# Patient Record
Sex: Male | Born: 1980 | Race: Black or African American | Hispanic: No | Smoking: Former smoker
Health system: Southern US, Community
[De-identification: ages and names within clinical notes are randomized; demographics above are authoritative.]

## PROBLEM LIST (undated history)

## (undated) DIAGNOSIS — M1990 Unspecified osteoarthritis, unspecified site: Secondary | ICD-10-CM

## (undated) DIAGNOSIS — I7300 Raynaud's syndrome without gangrene: Secondary | ICD-10-CM

## (undated) DIAGNOSIS — N341 Nonspecific urethritis: Secondary | ICD-10-CM

## (undated) DIAGNOSIS — L089 Local infection of the skin and subcutaneous tissue, unspecified: Secondary | ICD-10-CM

## (undated) DIAGNOSIS — M109 Gout, unspecified: Secondary | ICD-10-CM

## (undated) HISTORY — DX: Raynaud's syndrome without gangrene: I73.00

## (undated) HISTORY — DX: Nonspecific urethritis: N34.1

## (undated) HISTORY — DX: Local infection of the skin and subcutaneous tissue, unspecified: L08.9

---
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
## 2002-08-18 ENCOUNTER — Emergency Department (HOSPITAL_COMMUNITY): Admission: EM | Admit: 2002-08-18 | Discharge: 2002-08-18 | Payer: Self-pay | Admitting: Emergency Medicine

## 2002-09-14 ENCOUNTER — Emergency Department (HOSPITAL_COMMUNITY): Admission: EM | Admit: 2002-09-14 | Discharge: 2002-09-14 | Payer: Self-pay | Admitting: Emergency Medicine

## 2003-01-20 ENCOUNTER — Emergency Department (HOSPITAL_COMMUNITY): Admission: AD | Admit: 2003-01-20 | Discharge: 2003-01-20 | Payer: Self-pay | Admitting: Family Medicine

## 2004-07-25 ENCOUNTER — Emergency Department (HOSPITAL_COMMUNITY): Admission: EM | Admit: 2004-07-25 | Discharge: 2004-07-25 | Payer: Self-pay | Admitting: Family Medicine

## 2006-04-10 ENCOUNTER — Emergency Department (HOSPITAL_COMMUNITY): Admission: EM | Admit: 2006-04-10 | Discharge: 2006-04-10 | Payer: Self-pay | Admitting: Emergency Medicine

## 2006-06-16 ENCOUNTER — Emergency Department (HOSPITAL_COMMUNITY): Admission: EM | Admit: 2006-06-16 | Discharge: 2006-06-16 | Payer: Self-pay | Admitting: Emergency Medicine

## 2010-02-28 ENCOUNTER — Emergency Department (HOSPITAL_COMMUNITY)
Admission: EM | Admit: 2010-02-28 | Discharge: 2010-02-28 | Payer: Self-pay | Source: Home / Self Care | Admitting: Emergency Medicine

## 2010-04-13 ENCOUNTER — Emergency Department (HOSPITAL_COMMUNITY)
Admission: EM | Admit: 2010-04-13 | Discharge: 2010-04-13 | Payer: Self-pay | Source: Home / Self Care | Admitting: Emergency Medicine

## 2010-06-26 LAB — CBC
Platelets: 158 10*3/uL (ref 150–400)
RBC: 4.91 MIL/uL (ref 4.22–5.81)
WBC: 8.7 10*3/uL (ref 4.0–10.5)

## 2010-06-26 LAB — DIFFERENTIAL
Lymphocytes Relative: 45 % (ref 12–46)
Lymphs Abs: 3.9 10*3/uL (ref 0.7–4.0)
Neutro Abs: 3.7 10*3/uL (ref 1.7–7.7)
Neutrophils Relative %: 43 % (ref 43–77)

## 2010-09-05 DIAGNOSIS — L089 Local infection of the skin and subcutaneous tissue, unspecified: Secondary | ICD-10-CM | POA: Insufficient documentation

## 2010-09-17 DIAGNOSIS — L089 Local infection of the skin and subcutaneous tissue, unspecified: Secondary | ICD-10-CM

## 2010-09-24 ENCOUNTER — Other Ambulatory Visit: Payer: Self-pay | Admitting: Infectious Disease

## 2010-09-24 ENCOUNTER — Encounter: Payer: Self-pay | Admitting: Infectious Disease

## 2010-09-24 ENCOUNTER — Ambulatory Visit (INDEPENDENT_AMBULATORY_CARE_PROVIDER_SITE_OTHER): Payer: Self-pay | Admitting: Infectious Disease

## 2010-09-24 VITALS — BP 114/59 | HR 73 | Temp 98.3°F | Ht 75.0 in | Wt 246.0 lb

## 2010-09-24 DIAGNOSIS — M359 Systemic involvement of connective tissue, unspecified: Secondary | ICD-10-CM

## 2010-09-24 DIAGNOSIS — R768 Other specified abnormal immunological findings in serum: Secondary | ICD-10-CM | POA: Insufficient documentation

## 2010-09-24 DIAGNOSIS — L089 Local infection of the skin and subcutaneous tissue, unspecified: Secondary | ICD-10-CM

## 2010-09-24 DIAGNOSIS — M069 Rheumatoid arthritis, unspecified: Secondary | ICD-10-CM

## 2010-09-24 LAB — CBC WITH DIFFERENTIAL/PLATELET
Basophils Absolute: 0 10*3/uL (ref 0.0–0.1)
Eosinophils Absolute: 0.2 10*3/uL (ref 0.0–0.7)
Lymphocytes Relative: 41 % (ref 12–46)
Lymphs Abs: 3 10*3/uL (ref 0.7–4.0)
MCH: 27 pg (ref 26.0–34.0)
Neutrophils Relative %: 47 % (ref 43–77)
Platelets: 154 10*3/uL (ref 150–400)
RBC: 5.08 MIL/uL (ref 4.22–5.81)
RDW: 14.5 % (ref 11.5–15.5)
WBC: 7.3 10*3/uL (ref 4.0–10.5)

## 2010-09-24 LAB — HIV ANTIBODY (ROUTINE TESTING W REFLEX): HIV: NONREACTIVE

## 2010-09-24 NOTE — Patient Instructions (Signed)
This is a connective tissue disorder, most likely rheumatoid arthritis vs psoriatic arthritis  We will do blood work today but you will need to be seen by a Rheumatologist

## 2010-09-24 NOTE — Assessment & Plan Note (Signed)
See discussion below.

## 2010-09-24 NOTE — Assessment & Plan Note (Signed)
He likely did have an infection involving his middle finger of the other nodules that he has been scratching at currently there is no evidence of infection are are present at all. Certainly the underlying pathology that is causing the nodules in the swelling and pain in his digits is not an infectious one. It is possible to pause post infectious "Reiters" arthritis. But infection isn't the primary problem here.

## 2010-09-24 NOTE — Assessment & Plan Note (Signed)
I am suspicious that this patient has either rheumatoid arthritis or another connective tissue disease that is in the differential for for rheumatoid arthritis such as possibly psoriatic arthritis. He has what appear to be nodules overlying his PIP joints. He has episodes of swelling and pain in the PIP joints. He has what sounds like Raynaud's phenomenon. I will check her rheumatoid factor ANA a anti-CCA body. We'll check a viral hepatitis antibodies although I don't think he has any infectious disease mediating his arthritic condition. I would be happy to prescribe him corticosteroid should he need it but he does not seem to be in separate and severe pain at this point in time I would prefer to have him to be worked up by rheumatologist. In addition to the labs mentioned above we'll get a stat sedimentation rate C-reactive protein CBC differential count are his metabolic panel her if will refer him to rheumatology a loss for for him to a general S.

## 2010-09-24 NOTE — Progress Notes (Signed)
Subjective:    Patient ID: [name], male    DOB: 1980/12/29, 30 y.o.   MRN: [id number]  HPI  [name] is a 30 year old African American male who had not had any significant past medical history until approximately a year ago at which point in time he developed swelling of his fingers. He prior to this he had noticed what sounds like Raynaud's type phenomena where he would have discoloration of the proximal digits of his fingers with exposure to severe cold. He doesn't have a numbness sensation and pain present. Since then he had developed swelling as well and is in his both his proximal and distal interphalangeal interphalangeal joints and had developed nodules on the overlying the PIP joints. He has no primary care physician is not been seen for by rheumatologist for workup of this likely connective tissue disease. In the interim he has had superinfections of some of the nodules including one over his right middle finger. This was evaluated and responded at count where there was evidence of gas in the soft tissue. Given prescriptions for antibiotics and Fu with [place] with these. Since then the patient then seen again in the ER again for pain and in his hands and for the nodules. Occasionally these nodules will drain internally material but not all that often. They typically will this will typically happen after he scratches them. He is also noticed an area of excoriation overlying his left thumb. He was referred to Dr. Virginia Vasquez and lower with hand surgery for evaluation and treatment of his condition. Refer the patient to [place]. In talking to the patient he was evaluated for nongonococcal urethritis in 2004. He denies having any other episodes of this. He denies having such symptoms prior to the development of his pain in his PIP and PIP joints and the development of his nodules. He denies any family history of any connective tissue diseases. He denies any history of other sexual transmitted diseases he denies  being tested for HIV of her. He states he only had sex sex with women and claims over 30 different partners in his lifetime. He is accompanied today by his girlfriend who did leave the room during the interview with regards to his sexual history. He did he endorses use of alcohol a weekly basis. He also endorses marijuana use he denies cocaine and denies all intravenous drug use. Review of Systems    as in history present illness otherwise remainder of 12 RV of systems is negative. Objective:   Physical Exam    patient is alert and oriented x4. HEENT is normocephalic atraumatic his pupils are equal round react to light his sclera anicteric his oropharynx is clear without erythema exudate. Dentate dentition was fair progress exam reveal regular rate and rhythm no murmurs rubs rubs lungs are clear auscultation bilaterally without wheezes or rales abdomen soft nondistended nontender without evidence of hepatomegaly megaly or splenomegaly. His extremities are without edema. Neurologic exam was nonfocal with symmetrical strength and sensation and renal nerves were grossly intact. Skeletal exam revealed nodules overlying the PIP joints bilaterally. His PIPs joints were prominent but not tender to palpation. MCP joints were also not tender not guarded neither were his wrists or elbows. The PIP joints were also not especially tender. The skin on all overlying the DIP joint with her stress somewhat stretched shiny appearance to it the nails themselves did not have any lesions present on them he did have excoriation however on his thumb. He also another area  of dry skin excoriated skin on overlying his right elbow.    Assessment & Plan:  Connective tissue disease I am suspicious that this patient has either rheumatoid arthritis or another connective tissue disease that is in the differential for for rheumatoid arthritis such as possibly psoriatic arthritis. He has what appear to be nodules overlying his PIP  joints. He has episodes of swelling and pain in the PIP joints. He has what sounds like Raynaud's phenomenon. I will check her rheumatoid factor ANA a anti-CCA body. We'll check a viral hepatitis antibodies although I don't think he has any infectious disease mediating his arthritic condition. I would be happy to prescribe him corticosteroid should he need it but he does not seem to be in separate and severe pain at this point in time I would prefer to have him to be worked up by rheumatologist. In addition to the labs mentioned above we'll get a stat sedimentation rate C-reactive protein CBC differential count are his metabolic panel her if will refer him to rheumatology a loss for for him to a general S.  skin infection, bilateral hands He likely did have an infection involving his middle finger of the other nodules that he has been scratching at currently there is no evidence of infection are are present at all. Certainly the underlying pathology that is causing the nodules in the swelling and pain in his digits is not an infectious one. It is possible to pause post infectious "Reiters" arthritis. But infection isn't the primary problem here.  Rheumatoid arthritis See discussion below.

## 2010-09-25 LAB — RHEUMATOID FACTOR: Rhuematoid fact SerPl-aCnc: <10 IU/mL (ref <=14)

## 2010-09-25 LAB — HEPATITIS PANEL, ACUTE: Hep A IgM: NEGATIVE

## 2010-09-25 LAB — ANTI-NUCLEAR AB-TITER (ANA TITER): ANA Titer 1: 1:1280 {titer} — ABNORMAL HIGH

## 2010-09-25 LAB — C-REACTIVE PROTEIN: CRP: 0.2 mg/dL (ref <0.6)

## 2010-09-26 LAB — CYCLIC CITRUL PEPTIDE ANTIBODY, IGG: Cyclic Citrullin Peptide Ab: <2 U/mL (ref 0.0–5.0)

## 2010-10-24 ENCOUNTER — Ambulatory Visit: Payer: Self-pay | Admitting: Infectious Disease

## 2011-01-20 ENCOUNTER — Emergency Department (HOSPITAL_COMMUNITY)
Admission: EM | Admit: 2011-01-20 | Discharge: 2011-01-20 | Disposition: A | Discharge status: Home / Self Care | Payer: Self-pay | Attending: Emergency Medicine | Admitting: Emergency Medicine

## 2011-01-20 DIAGNOSIS — K047 Periapical abscess without sinus: Secondary | ICD-10-CM | POA: Insufficient documentation

## 2011-01-20 DIAGNOSIS — R220 Localized swelling, mass and lump, head: Secondary | ICD-10-CM | POA: Insufficient documentation

## 2011-01-20 DIAGNOSIS — K089 Disorder of teeth and supporting structures, unspecified: Secondary | ICD-10-CM | POA: Insufficient documentation

## 2011-01-20 DIAGNOSIS — K029 Dental caries, unspecified: Secondary | ICD-10-CM | POA: Insufficient documentation

## 2017-07-10 ENCOUNTER — Encounter (HOSPITAL_COMMUNITY): Payer: Self-pay

## 2017-07-10 ENCOUNTER — Emergency Department (HOSPITAL_COMMUNITY): Payer: Self-pay

## 2017-07-10 ENCOUNTER — Emergency Department (HOSPITAL_COMMUNITY)
Admission: EM | Admit: 2017-07-10 | Discharge: 2017-07-10 | Disposition: A | Discharge status: Home / Self Care | Payer: Self-pay | Attending: Emergency Medicine | Admitting: Emergency Medicine

## 2017-07-10 DIAGNOSIS — L98499 Non-pressure chronic ulcer of skin of other sites with unspecified severity: Secondary | ICD-10-CM

## 2017-07-10 DIAGNOSIS — L97521 Non-pressure chronic ulcer of other part of left foot limited to breakdown of skin: Secondary | ICD-10-CM | POA: Insufficient documentation

## 2017-07-10 DIAGNOSIS — F1210 Cannabis abuse, uncomplicated: Secondary | ICD-10-CM | POA: Insufficient documentation

## 2017-07-10 DIAGNOSIS — M069 Rheumatoid arthritis, unspecified: Secondary | ICD-10-CM | POA: Insufficient documentation

## 2017-07-10 LAB — BASIC METABOLIC PANEL
ANION GAP: 6 (ref 5–15)
BUN: 10 mg/dL (ref 6–20)
CALCIUM: 9.6 mg/dL (ref 8.9–10.3)
CO2: 30 mmol/L (ref 22–32)
CREATININE: 1.01 mg/dL (ref 0.61–1.24)
Chloride: 104 mmol/L (ref 101–111)
GFR calc Af Amer: >60 mL/min (ref >60)
GFR calc non Af Amer: >60 mL/min (ref >60)
GLUCOSE: 94 mg/dL (ref 65–99)
Potassium: 3.6 mmol/L (ref 3.5–5.1)
Sodium: 140 mmol/L (ref 135–145)

## 2017-07-10 LAB — CBC
HCT: 45.3 % (ref 39.0–52.0)
Hemoglobin: 14.2 g/dL (ref 13.0–17.0)
MCH: 27.3 pg (ref 26.0–34.0)
MCHC: 31.3 g/dL (ref 30.0–36.0)
MCV: 86.9 fL (ref 78.0–100.0)
Platelets: 212 10*3/uL (ref 150–400)
RBC: 5.21 MIL/uL (ref 4.22–5.81)
RDW: 13.9 % (ref 11.5–15.5)
WBC: 7.4 10*3/uL (ref 4.0–10.5)

## 2017-07-10 MED ORDER — SULFAMETHOXAZOLE-TRIMETHOPRIM 800-160 MG PO TABS: 1.0000 | ORAL_TABLET | Freq: Two times a day (BID) | ORAL | 0 refills | Status: AC

## 2017-07-10 NOTE — ED Provider Notes (Signed)
[facility]-EMERGENCY DEPT Provider Note   CSN: [id number] Arrival date & time: 07/10/17  1840     History   Chief Complaint Chief Complaint  Patient presents with  . Insect Bite    HPI [name] is a 37 y.o. male who presents today for evaluation of a wound on his lateral left foot.  He reports that it has been there for the past few months and is not getting better.  He reports that he showed it to a coworker who reported that it looked like a spider bite and so he presented today for evaluation.  He reports that he has not had any new shoes, however does note that he talks his shoelaces and on the side of his shoes and so it may have been rubbing there.  He reports that it is not overly painful.  No fevers chills nausea or vomiting.  He denies being a diabetic, reports that he has had a tetanus shot in the past few years.  HPI  Past Medical History:  Diagnosis Date  . NGU (nongonococcal urethritis)   . Skin infection     Patient Active Problem List   Diagnosis Date Noted  . Rheumatoid arthritis(714.0) 09/24/2010  . Connective tissue disease (HCC) 09/24/2010  . skin infection, bilateral hands 09/05/2010    History reviewed. No pertinent surgical history.      Home Medications    Prior to Admission medications   Medication Sig Start Date End Date Taking? Authorizing Provider  sulfamethoxazole-trimethoprim (BACTRIM DS,SEPTRA DS) 800-160 MG tablet Take 1 tablet by mouth 2 (two) times daily for 7 days. 07/10/17 07/17/17  Cristina Gong, PA-C    Family History Family History  Problem Relation Age of Onset  . Arthritis Mother   . Cancer Mother   . Arthritis Father   . Cancer Father     Social History Social History   Tobacco Use  . Smoking status: Never Smoker  . Smokeless tobacco: Never Used  Substance Use Topics  . Alcohol use: Yes    Alcohol/week: 1.2 oz    Types: 2 Cans of beer per week  . Drug use: Yes    Frequency: 3.0  times per week    Types: Marijuana     Allergies   Patient has no known allergies.   Review of Systems Review of Systems   Physical Exam Updated Vital Signs BP (!) 162/86 (BP Location: Left Arm)   Pulse 68   Temp 98 F (36.7 C) (Oral)   Resp 18   SpO2 100%   Physical Exam  Constitutional: He appears well-developed and well-nourished. No distress.  HENT:  Head: Normocephalic and atraumatic.  Eyes: Conjunctivae are normal. Right eye exhibits no discharge. Left eye exhibits no discharge. No scleral icterus.  Neck: Normal range of motion.  Cardiovascular: Normal rate and regular rhythm.  Pulmonary/Chest: Effort normal. No stridor. No respiratory distress.  Abdominal: He exhibits no distension.  Musculoskeletal: He exhibits no edema or deformity.  Neurological: He is alert. He exhibits normal muscle tone.  Skin: Skin is warm and dry. He is not diaphoretic.  There is a wound on the lateral aspect of the left foot.  Area was debrided.  See clinical image.  Wound is ulcer appearing, into subcutaneous tissue.   Psychiatric: He has a normal mood and affect. His behavior is normal.  Nursing note and vitals reviewed.        ED Treatments / Results  Labs (all labs  ordered are listed, but only abnormal results are displayed) Labs Reviewed  BASIC METABOLIC PANEL  CBC    EKG None  Radiology Dg Foot Complete Left  Result Date: 07/10/2017 CLINICAL DATA:  36 year old male with left foot wound for 1 month. EXAM: LEFT FOOT - COMPLETE 3+ VIEW COMPARISON:  None. FINDINGS: There is no acute fracture or dislocation. The bones are well mineralized. No arthritic changes. No bone erosion or periosteal elevation. Focal irregularity of the skin of the lateral aspect of the hindfoot corresponds to the known wound. IMPRESSION: Skin wound in the lateral hindfoot. No evidence of osteomyelitis by radiograph. Electronically Signed   By: Elgie Collard M.D.   On: 07/10/2017 22:41     Procedures Procedures (including critical care time)  Medications Ordered in ED Medications - No data to display   Initial Impression / Assessment and Plan / ED Course  I have reviewed the triage vital signs and the nursing notes.  Pertinent labs & imaging results that were available during my care of the patient were reviewed by me and considered in my medical decision making (see chart for details).    Patient presents today for evaluation of a wound on his left lateral foot for approximately 3 weeks.  He reports that he talks his shoelaces into his shoes  and suspect that that is what caused this.  He is afebrile, hemodynamically stable, based on appearance of wound concern for diabetes, BMP and CBC were obtained and normal.  Normal blood glucose.  X-rays were obtained without obvious evidence of subcutaneous air or osteomyelitis.  He will be started on Bactrim.  He was instructed to follow-up with his primary care provider, and that if he cannot be seen tomorrow and to come back in 2 days for wound check.  I personally instructed him on how to care for his wound and he stated his understanding.  Hemodynamically stable discharge home.   Final Clinical Impressions(s) / ED Diagnoses   Final diagnoses:  Skin ulcer, unspecified ulcer stage [facility])    ED Discharge Orders        Ordered    sulfamethoxazole-trimethoprim (BACTRIM DS,SEPTRA DS) 800-160 MG tablet  2 times daily     07/10/17 2331       Cristina Gong, PA-C 07/10/17 2346    Melene Plan, DO 07/11/17 1527

## 2017-07-10 NOTE — Discharge Instructions (Addendum)

## 2017-07-10 NOTE — ED Triage Notes (Signed)
Pt complains of a possible spider bite on his left foot, he says he didn't see a spider but noticed this area and it won't heal and is aching

## 2017-07-24 ENCOUNTER — Encounter (HOSPITAL_COMMUNITY): Payer: Self-pay | Admitting: *Deleted

## 2017-07-24 ENCOUNTER — Emergency Department (HOSPITAL_COMMUNITY)
Admission: EM | Admit: 2017-07-24 | Discharge: 2017-07-25 | Disposition: A | Discharge status: Home / Self Care | Payer: Self-pay | Attending: Emergency Medicine | Admitting: Emergency Medicine

## 2017-07-24 DIAGNOSIS — Z4800 Encounter for change or removal of nonsurgical wound dressing: Secondary | ICD-10-CM | POA: Insufficient documentation

## 2017-07-24 DIAGNOSIS — Z5189 Encounter for other specified aftercare: Secondary | ICD-10-CM

## 2017-07-24 LAB — CBC
HCT: 41.3 % (ref 39.0–52.0)
HEMOGLOBIN: 13 g/dL (ref 13.0–17.0)
MCH: 26.9 pg (ref 26.0–34.0)
MCHC: 31.5 g/dL (ref 30.0–36.0)
MCV: 85.5 fL (ref 78.0–100.0)
Platelets: 170 10*3/uL (ref 150–400)
RBC: 4.83 MIL/uL (ref 4.22–5.81)
RDW: 14 % (ref 11.5–15.5)
WBC: 6.4 10*3/uL (ref 4.0–10.5)

## 2017-07-24 LAB — BASIC METABOLIC PANEL
Anion gap: 9 (ref 5–15)
BUN: 10 mg/dL (ref 6–20)
CHLORIDE: 108 mmol/L (ref 101–111)
CO2: 24 mmol/L (ref 22–32)
Calcium: 9.3 mg/dL (ref 8.9–10.3)
Creatinine, Ser: 0.9 mg/dL (ref 0.61–1.24)
GFR calc Af Amer: >60 mL/min (ref >60)
GFR calc non Af Amer: >60 mL/min (ref >60)
GLUCOSE: 82 mg/dL (ref 65–99)
POTASSIUM: 3.7 mmol/L (ref 3.5–5.1)
SODIUM: 141 mmol/L (ref 135–145)

## 2017-07-24 MED ORDER — BACITRACIN ZINC 500 UNIT/GM EX OINT
TOPICAL_OINTMENT | Freq: Two times a day (BID) | CUTANEOUS | Status: DC
  Administered 2017-07-25: 1 via TOPICAL
  Filled 2017-07-24: qty 0.9

## 2017-07-24 NOTE — ED Triage Notes (Addendum)
Pt is here for re-evaluation of wound on the side of his left foot.  He was seen on 3/28 and given bactrim.  He had had this for a month before being seen. He completed this antibiotic.  No fever or chills with this.  Pt states that it has gotten better, less swelling, no more drainage.  but he is concerned that it has not healed yet.  Pt states that it hurts when walking on it.

## 2017-07-24 NOTE — ED Notes (Signed)
ED Provider at bedside. 

## 2017-07-24 NOTE — ED Notes (Signed)
Patient transported to X-ray 

## 2017-07-25 ENCOUNTER — Emergency Department (HOSPITAL_COMMUNITY): Payer: Self-pay

## 2017-07-25 MED ORDER — HYDROCODONE-ACETAMINOPHEN 5-325 MG PO TABS
1.0000 | ORAL_TABLET | Freq: Once | ORAL | Status: AC
  Administered 2017-07-25: 1 via ORAL
  Filled 2017-07-25: qty 1

## 2017-07-25 MED ORDER — TRAMADOL HCL 50 MG PO TABS: 50.0000 mg | ORAL_TABLET | Freq: Four times a day (QID) | ORAL | 0 refills | Status: DC | PRN

## 2017-07-25 NOTE — ED Provider Notes (Signed)
[facility] EMERGENCY DEPARTMENT Provider Note   CSN: [id number] Arrival date & time: 07/24/17  1939     History   Chief Complaint Chief Complaint  Patient presents with  . Wound Check    HPI [name] is a 37 y.o. male.  HPI 37 year old African-American male with no pertinent past medical history presents to the ED for ongoing evaluation of wound on his left lateral foot.  Patient states the wound has been there for the past few months.  States that he was seen in the ED on 3/28 for same.  Started on Bactrim at that time.  States that the wound has somewhat improved however it has persisted.  Patient states that the wound initially developed after his shoelaces were rubbing between his foot in the shoe.  Patient states the pain area is painful.  Denies any associated fevers, chills, nausea or vomiting.  Patient denies being a diabetic reports has had a tetanus shot the past 5 years.  Patient has not taking for the pain today but has been taking over-the-counter analgesic with only little relief.  Patient denies any associated paresthesias or weakness. Past Medical History:  Diagnosis Date  . NGU (nongonococcal urethritis)   . Skin infection     Patient Active Problem List   Diagnosis Date Noted  . Rheumatoid arthritis(714.0) 09/24/2010  . Connective tissue disease (HCC) 09/24/2010  . skin infection, bilateral hands 09/05/2010    History reviewed. No pertinent surgical history.      Home Medications    Prior to Admission medications   Medication Sig Start Date End Date Taking? Authorizing Provider  traMADol (ULTRAM) 50 MG tablet Take 1 tablet (50 mg total) by mouth every 6 (six) hours as needed. 07/25/17   Rise Mu, PA-C    Family History Family History  Problem Relation Age of Onset  . Arthritis Mother   . Cancer Mother   . Arthritis Father   . Cancer Father     Social History Social History   Tobacco Use  . Smoking status:  Never Smoker  . Smokeless tobacco: Never Used  Substance Use Topics  . Alcohol use: Yes    Alcohol/week: 1.2 oz    Types: 2 Cans of beer per week  . Drug use: Yes    Frequency: 3.0 times per week    Types: Marijuana     Allergies   Patient has no known allergies.   Review of Systems Review of Systems  All other systems reviewed and are negative.    Physical Exam Updated Vital Signs BP (!) 157/97 (BP Location: Right Arm)   Pulse 67   Temp 98.6 F (37 C) (Oral)   Resp 16   Wt 129.3 kg (285 lb)   SpO2 98%   BMI 35.62 kg/m   Physical Exam  Constitutional: He appears well-developed and well-nourished. No distress.  HENT:  Head: Normocephalic and atraumatic.  Eyes: Right eye exhibits no discharge. Left eye exhibits no discharge. No scleral icterus.  Neck: Normal range of motion.  Cardiovascular: Intact distal pulses.  Pulmonary/Chest: No respiratory distress.  Musculoskeletal: Normal range of motion.  DP pulses are 2+ bilaterally.  Brisk cap refill.  Sensation intact.  Neurological: He is alert.  Skin: Skin is warm and dry. Capillary refill takes less than 2 seconds. No pallor.  Wound noted to left lateral foot and stages of healing.  There is no discharge noted.  There is some ulceration noted but no purulent  drainage.  There is no erythema or warmth.  No area of fluctuance.  Psychiatric: His behavior is normal. Judgment and thought content normal.  Nursing note and vitals reviewed.      ED Treatments / Results  Labs (all labs ordered are listed, but only abnormal results are displayed) Labs Reviewed  CBC  BASIC METABOLIC PANEL    EKG None  Radiology Dg Foot 2 Views Left  Result Date: 07/25/2017 CLINICAL DATA:  Wound about the lateral aspect of left foot. EXAM: LEFT FOOT - 2 VIEW COMPARISON:  Radiographs 07/10/2017 FINDINGS: Skin and soft tissue irregularity noted about the lateral hindfoot. No tracking soft tissue air or radiopaque foreign body. No  abnormal density, periosteal reaction, or bony destructive change. Normal alignment. No fracture. Incidental os navicular again seen. IMPRESSION: Skin and soft tissue irregularity about the lateral hindfoot consistent with wound. No radiographic evidence of osteomyelitis. No significant change from prior radiographs. Electronically Signed   By: Rubye Oaks M.D.   On: 07/25/2017 00:40    Procedures Procedures (including critical care time)  Medications Ordered in ED Medications  HYDROcodone-acetaminophen (NORCO/VICODIN) 5-325 MG per tablet 1 tablet (1 tablet Oral Given 07/25/17 0040)     Initial Impression / Assessment and Plan / ED Course  I have reviewed the triage vital signs and the nursing notes.  Pertinent labs & imaging results that were available during my care of the patient were reviewed by me and considered in my medical decision making (see chart for details).     Patient presents to the ED for further evaluation of ongoing wound to his left lateral foot.  Denies any associated drainage.  Patient has no systemic symptoms of infection.  Lab work is reassuring.  No leukocytosis.  X-ray shows no bony abnormalities.  Wound appears to be healing from prior visit.  There is no overlying signs of infection at this time.  Encourage patient to apply topical antibiotic ointment and wet-to-dry dressings.  We will given wound follow-up.  Patient will give a short course of pain medication.  He remains neurovascularly intact.  No history of diabetes.  Pt is hemodynamically stable, in NAD, & able to ambulate in the ED. Evaluation does not show pathology that would require ongoing emergent intervention or inpatient treatment. I explained the diagnosis to the patient. Pain has been managed & has no complaints prior to dc. Pt is comfortable with above plan and is stable for discharge at this time. All questions were answered prior to disposition. Strict return precautions for f/u to the ED  were discussed. Encouraged follow up with PCP.   Final Clinical Impressions(s) / ED Diagnoses   Final diagnoses:  Visit for wound check    ED Discharge Orders        Ordered    traMADol (ULTRAM) 50 MG tablet  Every 6 hours PRN     07/25/17 0111       Rise Mu, PA-C 07/25/17 0800    Tegeler, Canary Brim, MD 07/28/17 1030

## 2017-07-25 NOTE — Discharge Instructions (Addendum)
X-ray was normal.  Your wound does appear to be healing.  I would recommend using a topical antibiotic ointment such as Neosporin.  Wet-to-dry dressings.  Have given you wound follow-up.  Have given you short course of pain medication.  Medication make you drowsy so do not drive with it.  Continue take Motrin and Tylenol for pain.  Use of crutches for weightbearing as tolerated.

## 2017-08-06 ENCOUNTER — Encounter (HOSPITAL_BASED_OUTPATIENT_CLINIC_OR_DEPARTMENT_OTHER): Payer: Self-pay | Attending: Physician Assistant

## 2017-08-06 DIAGNOSIS — Z87891 Personal history of nicotine dependence: Secondary | ICD-10-CM | POA: Insufficient documentation

## 2017-08-06 DIAGNOSIS — M0580 Other rheumatoid arthritis with rheumatoid factor of unspecified site: Secondary | ICD-10-CM | POA: Insufficient documentation

## 2017-08-06 DIAGNOSIS — L97522 Non-pressure chronic ulcer of other part of left foot with fat layer exposed: Secondary | ICD-10-CM | POA: Insufficient documentation

## 2017-08-13 ENCOUNTER — Encounter (HOSPITAL_BASED_OUTPATIENT_CLINIC_OR_DEPARTMENT_OTHER): Payer: Self-pay | Attending: Physician Assistant

## 2017-08-13 DIAGNOSIS — L97522 Non-pressure chronic ulcer of other part of left foot with fat layer exposed: Secondary | ICD-10-CM | POA: Insufficient documentation

## 2017-08-13 DIAGNOSIS — Z87891 Personal history of nicotine dependence: Secondary | ICD-10-CM | POA: Insufficient documentation

## 2017-11-04 ENCOUNTER — Other Ambulatory Visit: Payer: Self-pay

## 2017-11-04 ENCOUNTER — Emergency Department (HOSPITAL_COMMUNITY)
Admission: EM | Admit: 2017-11-04 | Discharge: 2017-11-04 | Disposition: A | Discharge status: Home / Self Care | Payer: Self-pay | Attending: Emergency Medicine | Admitting: Emergency Medicine

## 2017-11-04 ENCOUNTER — Encounter (HOSPITAL_COMMUNITY): Payer: Self-pay | Admitting: Emergency Medicine

## 2017-11-04 DIAGNOSIS — T148XXA Other injury of unspecified body region, initial encounter: Secondary | ICD-10-CM

## 2017-11-04 DIAGNOSIS — L089 Local infection of the skin and subcutaneous tissue, unspecified: Secondary | ICD-10-CM | POA: Insufficient documentation

## 2017-11-04 HISTORY — DX: Gout, unspecified: M10.9

## 2017-11-04 LAB — CBG MONITORING, ED: Glucose-Capillary: 56 mg/dL — ABNORMAL LOW (ref 70–99)

## 2017-11-04 MED ORDER — DOXYCYCLINE HYCLATE 100 MG PO TABS
100.0000 mg | ORAL_TABLET | Freq: Once | ORAL | Status: AC
  Administered 2017-11-04: 100 mg via ORAL
  Filled 2017-11-04: qty 1

## 2017-11-04 MED ORDER — OXYCODONE-ACETAMINOPHEN 5-325 MG PO TABS
1.0000 | ORAL_TABLET | Freq: Once | ORAL | Status: AC
  Administered 2017-11-04: 1 via ORAL
  Filled 2017-11-04: qty 1

## 2017-11-04 MED ORDER — DOXYCYCLINE HYCLATE 100 MG PO CAPS: 100.0000 mg | ORAL_CAPSULE | Freq: Two times a day (BID) | ORAL | 0 refills | Status: DC

## 2017-11-04 MED ORDER — IBUPROFEN 400 MG PO TABS: 600.0000 mg | ORAL_TABLET | Freq: Once | ORAL | Status: DC

## 2017-11-04 NOTE — ED Triage Notes (Signed)
Pt reports rt foot pain/gout that started about 2-3 weeks ago which has gotten worse tonight. Pt took 800mg  of ibuprofen with some relief. Pt ambulatory in triage. Denies any injury. Hx of gout.

## 2017-11-04 NOTE — ED Notes (Signed)
Patient verbalizes understanding of discharge instructions. Opportunity for questioning and answers were provided. Armband removed by staff, pt discharged from ED.  

## 2017-11-04 NOTE — ED Provider Notes (Signed)
[facility] EMERGENCY DEPARTMENT Provider Note   CSN: [id number] Arrival date & time: 11/04/17  0035     History   Chief Complaint Chief Complaint  Patient presents with  . Gout    HPI [name] is a 37 y.o. male.  HPI 37 year old male presents the emergency department with a chronic nonhealing wound over the past 3 weeks.  He states worsening pain tonight.  No fevers or chills.  He reports some small surrounding redness.  He had a similar area on his left foot approximately a year ago and this responded to antibiotics and local wound care.  No history of diabetes.  No known injury.  Denies new shoes.  Pain is moderate in severity   Past Medical History:  Diagnosis Date  . Gout   . NGU (nongonococcal urethritis)   . Skin infection     Patient Active Problem List   Diagnosis Date Noted  . Rheumatoid arthritis(714.0) 09/24/2010  . Connective tissue disease (HCC) 09/24/2010  . skin infection, bilateral hands 09/05/2010    History reviewed. No pertinent surgical history.      Home Medications    Prior to Admission medications   Medication Sig Start Date End Date Taking? Authorizing Provider  traMADol (ULTRAM) 50 MG tablet Take 1 tablet (50 mg total) by mouth every 6 (six) hours as needed. 07/25/17   Rise Mu, PA-C    Family History Family History  Problem Relation Age of Onset  . Arthritis Mother   . Cancer Mother   . Arthritis Father   . Cancer Father     Social History Social History   Tobacco Use  . Smoking status: Never Smoker  . Smokeless tobacco: Never Used  Substance Use Topics  . Alcohol use: Yes    Alcohol/week: 1.2 oz    Types: 2 Cans of beer per week  . Drug use: Yes    Frequency: 3.0 times per week    Types: Marijuana     Allergies   Patient has no known allergies.   Review of Systems Review of Systems  All other systems reviewed and are negative.    Physical Exam Updated Vital Signs BP  137/82 (BP Location: Right Arm)   Pulse 73   Temp 98.6 F (37 C)   Resp 16   Ht 6\' 3"  (1.905 m)   Wt 122.5 kg (270 lb)   SpO2 100%   BMI 33.75 kg/m   Physical Exam  Constitutional: He is oriented to person, place, and time. He appears well-developed and well-nourished.  HENT:  Head: Normocephalic.  Eyes: EOM are normal.  Neck: Normal range of motion.  Pulmonary/Chest: Effort normal.  Abdominal: He exhibits no distension.  Musculoskeletal: Normal range of motion.  Acute on chronic wound of the lateral dorsal surface of the right foot.  Normal PT and DP pulse in the right foot.  Small surrounding erythema and warmth without drainage.  Neurological: He is alert and oriented to person, place, and time.  Psychiatric: He has a normal mood and affect.  Nursing note and vitals reviewed.    ED Treatments / Results  Labs (all labs ordered are listed, but only abnormal results are displayed) Labs Reviewed  CBG MONITORING, ED    EKG None  Radiology No results found.  Procedures Procedures (including critical care time)  Medications Ordered in ED Medications  doxycycline (VIBRA-TABS) tablet 100 mg (has no administration in time range)  oxyCODONE-acetaminophen (PERCOCET/ROXICET) 5-325 MG per tablet  1 tablet (has no administration in time range)     Initial Impression / Assessment and Plan / ED Course  I have reviewed the triage vital signs and the nursing notes.  Pertinent labs & imaging results that were available during my care of the patient were reviewed by me and considered in my medical decision making (see chart for details).     There is a chronic component to this wound but there is definitely an acute center which appears infected with a small surrounding cellulitis.  No drainable abscess on examination.  Normal pulses in right foot.  Local wound care with topical antibiotic cream recommended as well as twice a day antibacterial warm water soaks.  I have asked  that he call the wound care center back which managed his wound last year on the other foot which healed well.  Home with prescriptions for doxycycline.  Patient understands to return to the ER for new or worsening symptoms.  He will need evaluation by primary care physician as well  Final Clinical Impressions(s) / ED Diagnoses   Final diagnoses:  None    ED Discharge Orders    None       Azalia Bilis, MD 11/04/17 0430

## 2017-11-04 NOTE — ED Notes (Signed)
Pt given orange juice and crackers

## 2017-11-19 ENCOUNTER — Ambulatory Visit: Payer: Self-pay | Attending: Family Medicine | Admitting: Physician Assistant

## 2017-11-19 VITALS — BP 151/92 | HR 59 | Temp 98.5°F | Resp 16 | Ht 75.0 in | Wt 284.4 lb

## 2017-11-19 DIAGNOSIS — L97519 Non-pressure chronic ulcer of other part of right foot with unspecified severity: Secondary | ICD-10-CM | POA: Insufficient documentation

## 2017-11-19 DIAGNOSIS — Z09 Encounter for follow-up examination after completed treatment for conditions other than malignant neoplasm: Secondary | ICD-10-CM

## 2017-11-19 DIAGNOSIS — R030 Elevated blood-pressure reading, without diagnosis of hypertension: Secondary | ICD-10-CM | POA: Insufficient documentation

## 2017-11-19 DIAGNOSIS — Z79899 Other long term (current) drug therapy: Secondary | ICD-10-CM | POA: Insufficient documentation

## 2017-11-19 DIAGNOSIS — M79671 Pain in right foot: Secondary | ICD-10-CM | POA: Insufficient documentation

## 2017-11-19 DIAGNOSIS — M109 Gout, unspecified: Secondary | ICD-10-CM | POA: Insufficient documentation

## 2017-11-19 MED ORDER — NAPROXEN 500 MG PO TABS: 500.0000 mg | ORAL_TABLET | Freq: Two times a day (BID) | ORAL | 0 refills | Status: DC

## 2017-11-19 MED ORDER — SULFAMETHOXAZOLE-TRIMETHOPRIM 800-160 MG PO TABS: 1.0000 | ORAL_TABLET | Freq: Two times a day (BID) | ORAL | 0 refills | Status: DC

## 2017-11-19 MED ORDER — MUPIROCIN 2 % EX OINT: TOPICAL_OINTMENT | CUTANEOUS | 0 refills | Status: DC

## 2017-11-19 MED ORDER — TRAMADOL HCL 50 MG PO TABS
50.0000 mg | ORAL_TABLET | Freq: Three times a day (TID) | ORAL | 0 refills | Status: DC | PRN
Start: 2017-11-19 — End: 2017-12-17

## 2017-11-19 NOTE — Progress Notes (Signed)
Patient ID: [name], male   DOB: 24-Mar-1981, 37 y.o.   MRN: [id number]         [name], is a 37 y.o. male  [name]  [name]  DOB - Aug 16, 1980  Subjective:  Chief Complaint and HPI: [name] is a 37 y.o. male here today to establish care and for a follow up visit After being seen in the ED 11/04/2017 for foot infection.  He had the same thing on his L foot about 4-5 months ago.  Doxycycline didn't help.  Bactrim helped before.  He also had to go to the wound care center in the past for a foot ulceration.  Not diabetic. He has a h/o "connective tissue disease."  No f/c.  Pain in foot is bad OTC aleve not helping.  Pain with weight-bearing.      From ED note: There is a chronic component to this wound but there is definitely an acute center which appears infected with a small surrounding cellulitis.  No drainable abscess on examination.  Normal pulses in right foot.  Local wound care with topical antibiotic cream recommended as well as twice a day antibacterial warm water soaks.  I have asked that he call the wound care center back which managed his wound last year on the other foot which healed well.  Home with prescriptions for doxycycline.  Patient understands to return to the ER for new or worsening symptoms.  He will need evaluation by primary care physician as well  ED/Hospital notes/labs reviewed.     ROS:   Constitutional:  No f/c, No night sweats, No unexplained weight loss. EENT:  No vision changes, No blurry vision, No hearing changes. No mouth, throat, or ear problems.  Respiratory: No cough, No SOB Cardiac: No CP, no palpitations GI:  No abd pain, No N/V/D. GU: No Urinary s/sx Musculoskeletal: +R foot pain Neuro: No headache, no dizziness, no motor weakness.  Skin: No rash Endocrine:  No polydipsia. No polyuria.  Psych: Denies SI/HI  No problems updated.  ALLERGIES: No Known Allergies  PAST MEDICAL HISTORY: Past Medical History:  Diagnosis  Date  . Gout   . NGU (nongonococcal urethritis)   . Skin infection     MEDICATIONS AT HOME: Prior to Admission medications   Medication Sig Start Date End Date Taking? Authorizing Provider  doxycycline (VIBRAMYCIN) 100 MG capsule Take 1 capsule (100 mg total) by mouth 2 (two) times daily. Patient not taking: Reported on 11/19/2017 11/04/17   Robert Bilis, MD  mupirocin ointment Idelle Jo) 2 % Apply bid 11/19/17   Robert Simmonds, PA-C  naproxen (NAPROSYN) 500 MG tablet Take 1 tablet (500 mg total) by mouth 2 (two) times daily with a meal. 11/19/17   [name], Robert Schlein, PA-C  sulfamethoxazole-trimethoprim (BACTRIM DS,SEPTRA DS) 800-160 MG tablet Take 1 tablet by mouth 2 (two) times daily. 11/19/17   Robert Simmonds, PA-C  traMADol (ULTRAM) 50 MG tablet Take 1 tablet (50 mg total) by mouth every 8 (eight) hours as needed. 11/19/17   Robert Simmonds, PA-C     Objective:  EXAM:   Vitals:   11/19/17 1504 11/19/17 1506  BP: (!) 158/100 (!) 151/92  Pulse: (!) 59   Resp: 16   Temp: 98.5 F (36.9 C)   TempSrc: Oral   SpO2: 99%   Weight: 284 lb 6.4 oz (129 kg)   Height: 6\' 3"  (1.905 m)     General appearance : A&OX3. NAD. Non-toxic-appearing HEENT: Atraumatic and Normocephalic.  PERRLA. EOM intact.  Neck: supple, no JVD. No cervical lymphadenopathy. No thyromegaly Chest/Lungs:  Breathing-non-labored, Good air entry bilaterally, breath sounds normal without rales, rhonchi, or wheezing  CVS: S1 S2 regular, no murmurs, gallops, rubs  Extremities: Bilateral Lower Ext shows no edema, both legs are warm to touch with = pulse throughout.  R foot lateral aspect proximally, there is an 2X3 cm ulceration with surrounding erythema.  No fluctuance or induration Neurology:  CN II-XII grossly intact, Non focal.   Psych:  TP linear. J/I WNL. Normal speech. Appropriate eye contact and affect.  Skin:  No Rash  Data Review No results found for: HGBA1C   Assessment & Plan   1. Elevated blood  pressure reading Check blood pressure OOO 3 times per week and record and bring to next visit  2. Foot ulceration, right, with unspecified severity (HCC) Unclear etiology since occurred on L foot previously.   - sulfamethoxazole-trimethoprim (BACTRIM DS,SEPTRA DS) 800-160 MG tablet; Take 1 tablet by mouth 2 (two) times daily.  Dispense: 20 tablet; Refill: 0 - mupirocin ointment (BACTROBAN) 2 %; Apply bid  Dispense: 22 g; Refill: 0 - AMB referral to wound care center  3. Right foot pain - traMADol (ULTRAM) 50 MG tablet; Take 1 tablet (50 mg total) by mouth every 8 (eight) hours as needed.  Dispense: 30 tablet; Refill: 0 - naproxen (NAPROSYN) 500 MG tablet; Take 1 tablet (500 mg total) by mouth 2 (two) times daily with a meal.  Dispense: 30 tablet; Refill: 0  4. Hospital discharge follow-up No change   Patient have been counseled extensively about nutrition and exercise  Return in about 3 weeks (around 12/10/2017) for assign PCP; follow-up wound R foot and elevated BP.  The patient was given clear instructions to go to ER or return to medical center if symptoms don't improve, worsen or new problems develop. The patient verbalized understanding. The patient was told to call to get lab results if they haven't heard anything in the next week.     Robert Co, PA-C [facility] [place], [place] [phone]   11/19/2017, 3:20 PM

## 2017-11-19 NOTE — Patient Instructions (Signed)
Check blood pressure outside of the office 3 times/week and record and bring to next office visit.

## 2017-12-08 ENCOUNTER — Encounter (HOSPITAL_BASED_OUTPATIENT_CLINIC_OR_DEPARTMENT_OTHER): Payer: Self-pay | Attending: Internal Medicine

## 2017-12-08 DIAGNOSIS — L97522 Non-pressure chronic ulcer of other part of left foot with fat layer exposed: Secondary | ICD-10-CM | POA: Insufficient documentation

## 2017-12-08 DIAGNOSIS — Z87891 Personal history of nicotine dependence: Secondary | ICD-10-CM | POA: Insufficient documentation

## 2017-12-08 DIAGNOSIS — L97322 Non-pressure chronic ulcer of left ankle with fat layer exposed: Secondary | ICD-10-CM | POA: Insufficient documentation

## 2017-12-08 DIAGNOSIS — I7300 Raynaud's syndrome without gangrene: Secondary | ICD-10-CM | POA: Insufficient documentation

## 2017-12-17 ENCOUNTER — Encounter: Payer: Self-pay | Admitting: Family Medicine

## 2017-12-17 ENCOUNTER — Other Ambulatory Visit: Payer: Self-pay

## 2017-12-17 ENCOUNTER — Ambulatory Visit: Payer: Self-pay | Attending: Family Medicine | Admitting: Family Medicine

## 2017-12-17 VITALS — BP 129/88 | HR 85 | Temp 98.8°F | Resp 18 | Ht 75.0 in | Wt 270.6 lb

## 2017-12-17 DIAGNOSIS — L97319 Non-pressure chronic ulcer of right ankle with unspecified severity: Secondary | ICD-10-CM | POA: Insufficient documentation

## 2017-12-17 DIAGNOSIS — T148XXA Other injury of unspecified body region, initial encounter: Secondary | ICD-10-CM

## 2017-12-17 DIAGNOSIS — L97519 Non-pressure chronic ulcer of other part of right foot with unspecified severity: Secondary | ICD-10-CM

## 2017-12-17 DIAGNOSIS — L089 Local infection of the skin and subcutaneous tissue, unspecified: Secondary | ICD-10-CM

## 2017-12-17 DIAGNOSIS — Z809 Family history of malignant neoplasm, unspecified: Secondary | ICD-10-CM | POA: Insufficient documentation

## 2017-12-17 DIAGNOSIS — L97529 Non-pressure chronic ulcer of other part of left foot with unspecified severity: Secondary | ICD-10-CM | POA: Insufficient documentation

## 2017-12-17 DIAGNOSIS — M109 Gout, unspecified: Secondary | ICD-10-CM | POA: Insufficient documentation

## 2017-12-17 DIAGNOSIS — M79671 Pain in right foot: Secondary | ICD-10-CM

## 2017-12-17 DIAGNOSIS — Z8261 Family history of arthritis: Secondary | ICD-10-CM | POA: Insufficient documentation

## 2017-12-17 MED ORDER — TRAMADOL HCL 50 MG PO TABS: 50.0000 mg | ORAL_TABLET | Freq: Four times a day (QID) | ORAL | 0 refills | Status: AC | PRN

## 2017-12-17 MED ORDER — SULFAMETHOXAZOLE-TRIMETHOPRIM 800-160 MG PO TABS: 1.0000 | ORAL_TABLET | Freq: Two times a day (BID) | ORAL | 0 refills | Status: DC

## 2017-12-17 MED ORDER — DOXYCYCLINE HYCLATE 100 MG PO CAPS: 100.0000 mg | ORAL_CAPSULE | Freq: Two times a day (BID) | ORAL | 0 refills | Status: DC

## 2017-12-17 NOTE — Progress Notes (Signed)
Subjective:    Patient ID: [name], male    DOB: [birth year], 37 y.o.   MRN: [id number]  HPI       37 year old male who is seen in follow-up of the wound to the right foot.  Patient was last seen in this office on 11/19/2017.  Patient was prescribed antibiotics and pain medication.  Patient reports that he never received the antibiotics from his pharmacy.  Patient states that he continues to have drainage and pain in the right foot and is now getting a ulcerated area on the left foot with drainage.  Patient reports that he did go to wound care earlier this week.  Patient reports that the wound was cultured. (I did not see culture result and therefore will repeat wound culture at today's visit.)  Patient denies any fever or chills.  Patient states that his foot pain is constant and worse with standing.  Patient states that when he stands or walks, his foot pain as an 8 on a 0-to-10 scale.  Patient states that pain can be sharp and throbbing. Past Medical History:  Diagnosis Date  . Gout   . NGU (nongonococcal urethritis)   . Skin infection   History reviewed. No pertinent surgical history.  Family History  Problem Relation Age of Onset  . Arthritis Mother   . Cancer Mother   . Arthritis Father   . Cancer Father    Social History   Tobacco Use  . Smoking status: Never Smoker  . Smokeless tobacco: Never Used  Substance Use Topics  . Alcohol use: Yes    Alcohol/week: 2.0 standard drinks    Types: 2 Cans of beer per week  . Drug use: Yes    Frequency: 3.0 times per week    Types: Marijuana  No Known Allergies   Review of Systems  Constitutional: Positive for fatigue. Negative for chills and fever.  Respiratory: Negative for cough and shortness of breath.   Cardiovascular: Negative for chest pain and palpitations.  Gastrointestinal: Negative for abdominal pain and nausea.  Genitourinary: Negative for dysuria and frequency.  Musculoskeletal: Positive for arthralgias (Reports  continued pain in the right foot) and gait problem.  Neurological: Negative for dizziness and headaches.       Objective:   Physical Exam BP 129/88 (BP Location: Left Arm, Patient Position: Sitting, Cuff Size: Large)   Pulse 85   Temp 98.8 F (37.1 C) (Oral)   Resp 18   Ht 6\' 3"  (1.905 m)   Wt 270 lb 9.6 oz (122.7 kg)   SpO2 98%   BMI 33.82 kg/m   Vital signs reviewed General-well-nourished, well-developed, overweight for height male in no acute distress Lungs-clear to auscultation bilaterally. Cardiovascular-regular rate and rhythm; patient with palpable right distal lower extremity pulses which are within normal  Abdomen-soft, nontender Extremities-no edema Skin- patient with shallow, cervical ulcerations, 3 on the right lateral ankle/foot below the malleolus and patient with one ulceration on the left medial ankle below the malleolus.  Patient does have some beige appearing drainage on the superior area of ulceration on the right ankle.  Patient has serosanguineous drainage on the bandages covering both ankles.  Patient has tenderness to palpation in the area of the ulcerations bilaterally.    Assessment & Plan:  1. Foot ulceration, right, with unspecified severity (HCC) Patient with 3 ulcerations on the right ankle and one ulceration on the left.  Patient's last office visit note was reviewed.  Patient states that he did  not receive the antibiotics that were prescribed and that only the pain medication was at the pharmacy.  Patient will have wound culture today as I could not find any evidence of prior wound culture and I did not see a note in his chart from wound care.  Patient was previously prescribed Septra DS and doxycycline and these medications will be sent to his pharmacy along with a note to the pharmacist that he cannot pick up the pain medication without also receiving the antibiotics.  Patient will have CBC done at today's visit as well.  Patient should continue wound care  if he is currently attending.  Patient will follow-up in 1 week. - WOUND CULTURE - CBC with Differential - sulfamethoxazole-trimethoprim (BACTRIM DS,SEPTRA DS) 800-160 MG tablet; Take 1 tablet by mouth 2 (two) times daily.  Dispense: 20 tablet; Refill: 0  2. Infected open wound Patient with an infected, open wound on the right outer ankle and left inner ankle.  Wound culture done at today's visit.  Patient will also have CBC to look for elevated white blood cell count related to infection.  Patient is to pick up prescriptions at his pharmacy for doxycycline and Septra DS and new prescription provided for tramadol as needed for pain.  Patient should seek medical attention if he has fever/chills, worsening pain, worsening of wound or any concerns.  Patient will otherwise follow-up here in clinic in 1 week.- WOUND CULTURE - CBC with Differential - doxycycline (VIBRAMYCIN) 100 MG capsule; Take 1 capsule (100 mg total) by mouth 2 (two) times daily.  Dispense: 14 capsule; Refill: 0 - traMADol (ULTRAM) 50 MG tablet; Take 1 tablet (50 mg total) by mouth every 6 (six) hours as needed for up to 14 days for severe pain. Patient must also pick up Abx  Dispense: 56 tablet; Refill: 0  3. Right foot pain Patient with right foot pain secondary to open wound and prescription was sent to his pharmacy for tramadol to take as needed for pain  An After Visit Summary was printed and given to the patient.  Return in about 1 week (around 12/24/2017) for foot wound; sooner if worse.

## 2017-12-18 LAB — CBC WITH DIFFERENTIAL/PLATELET
Basophils Absolute: 0 x10E3/uL (ref 0.0–0.2)
Basos: 0 %
EOS (ABSOLUTE): 0.4 x10E3/uL (ref 0.0–0.4)
Eos: 4 %
Hematocrit: 46 % (ref 37.5–51.0)
Hemoglobin: 14.6 g/dL (ref 13.0–17.7)
Immature Grans (Abs): 0 x10E3/uL (ref 0.0–0.1)
Immature Granulocytes: 0 %
Lymphocytes Absolute: 2.6 x10E3/uL (ref 0.7–3.1)
Lymphs: 31 %
MCH: 27.1 pg (ref 26.6–33.0)
MCHC: 31.7 g/dL (ref 31.5–35.7)
MCV: 86 fL (ref 79–97)
Monocytes Absolute: 0.6 x10E3/uL (ref 0.1–0.9)
Monocytes: 7 %
Neutrophils Absolute: 4.8 x10E3/uL (ref 1.4–7.0)
Neutrophils: 58 %
Platelets: 206 x10E3/uL (ref 150–450)
RBC: 5.38 x10E6/uL (ref 4.14–5.80)
RDW: 14.5 % (ref 12.3–15.4)
WBC: 8.4 x10E3/uL (ref 3.4–10.8)

## 2017-12-22 ENCOUNTER — Encounter (HOSPITAL_BASED_OUTPATIENT_CLINIC_OR_DEPARTMENT_OTHER): Payer: Self-pay | Attending: Internal Medicine

## 2017-12-22 DIAGNOSIS — L97322 Non-pressure chronic ulcer of left ankle with fat layer exposed: Secondary | ICD-10-CM | POA: Insufficient documentation

## 2017-12-22 DIAGNOSIS — I7300 Raynaud's syndrome without gangrene: Secondary | ICD-10-CM | POA: Insufficient documentation

## 2017-12-22 DIAGNOSIS — M0689 Other specified rheumatoid arthritis, multiple sites: Secondary | ICD-10-CM | POA: Insufficient documentation

## 2017-12-22 DIAGNOSIS — L97512 Non-pressure chronic ulcer of other part of right foot with fat layer exposed: Secondary | ICD-10-CM | POA: Insufficient documentation

## 2017-12-22 LAB — WOUND CULTURE

## 2017-12-23 ENCOUNTER — Encounter (HOSPITAL_BASED_OUTPATIENT_CLINIC_OR_DEPARTMENT_OTHER): Payer: Self-pay

## 2017-12-24 ENCOUNTER — Telehealth: Payer: Self-pay

## 2017-12-24 NOTE — Telephone Encounter (Signed)
Patient was called, no answer, lvm to return call. If patient return call please notify him that his wound culture was positive for the growth of pseudomonas aeruginosa (BACTERIA) and a different antibiotic will be sent into his pharmacy-Cipro that the bacteria is sensitive to. He needs to start the new antibiotic as soon as possible and keep his follow-up visit Notify patient of normal CBC

## 2017-12-24 NOTE — Telephone Encounter (Signed)
-----   Message from Cain Saupe, MD sent at 12/19/2017  6:04 PM EDT ----- Wound culture final report is not yet available-please recheck on Monday to see if final results received

## 2018-01-02 ENCOUNTER — Ambulatory Visit: Payer: Self-pay | Admitting: Family Medicine

## 2018-01-14 ENCOUNTER — Encounter (HOSPITAL_BASED_OUTPATIENT_CLINIC_OR_DEPARTMENT_OTHER): Payer: Self-pay | Attending: Physician Assistant

## 2018-01-14 DIAGNOSIS — L97512 Non-pressure chronic ulcer of other part of right foot with fat layer exposed: Secondary | ICD-10-CM | POA: Insufficient documentation

## 2018-01-14 DIAGNOSIS — L97322 Non-pressure chronic ulcer of left ankle with fat layer exposed: Secondary | ICD-10-CM | POA: Insufficient documentation

## 2018-02-13 ENCOUNTER — Encounter (HOSPITAL_BASED_OUTPATIENT_CLINIC_OR_DEPARTMENT_OTHER): Payer: Self-pay | Attending: Internal Medicine

## 2018-12-28 ENCOUNTER — Encounter (HOSPITAL_COMMUNITY): Payer: Self-pay

## 2018-12-28 ENCOUNTER — Ambulatory Visit (HOSPITAL_COMMUNITY)
Admission: EM | Admit: 2018-12-28 | Discharge: 2018-12-28 | Disposition: A | Discharge status: Home / Self Care | Payer: BC Managed Care – PPO | Attending: Family Medicine | Admitting: Family Medicine

## 2018-12-28 ENCOUNTER — Other Ambulatory Visit: Payer: Self-pay

## 2018-12-28 DIAGNOSIS — M25532 Pain in left wrist: Secondary | ICD-10-CM | POA: Diagnosis not present

## 2018-12-28 MED ORDER — IBUPROFEN 600 MG PO TABS: 600.0000 mg | ORAL_TABLET | Freq: Four times a day (QID) | ORAL | 0 refills | Status: DC | PRN

## 2018-12-28 NOTE — Discharge Instructions (Addendum)
Please try ice  Please try ibuprofen  Please try the range of motion exercises

## 2018-12-28 NOTE — ED Provider Notes (Signed)
[facility]    CSN: [id number] Arrival date & time: 12/28/18  1944      History   Chief Complaint Chief Complaint  Patient presents with  . Wrist Pain    HPI [name] is a 38 y.o. male.   He is presenting with acute left wrist pain.  The pain started after this morning when he slept a specific way.  The pain is occurring over the dorsal carpal bone.  He denies any specific inciting event.  Has not taken anything for the pain.  It is localized to the wrist.  Pain is improved since this morning.  Denies any mechanical symptoms.  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   HPI  Past Medical History:  Diagnosis Date  . Gout   . NGU (nongonococcal urethritis)   . Skin infection     Patient Active Problem List   Diagnosis Date Noted  . Rheumatoid arthritis(714.0) 09/24/2010  . Connective tissue disease (HCC) 09/24/2010  . skin infection, bilateral hands 09/05/2010    History reviewed. No pertinent surgical history.     Home Medications    Prior to Admission medications   Medication Sig Start Date End Date Taking? Authorizing Provider  doxycycline (VIBRAMYCIN) 100 MG capsule Take 1 capsule (100 mg total) by mouth 2 (two) times daily. 12/17/17   Fulp, Cammie, MD  ibuprofen (ADVIL) 600 MG tablet Take 1 tablet (600 mg total) by mouth every 6 (six) hours as needed. 12/28/18   Myra Rude, MD  mupirocin ointment Idelle Jo) 2 % Apply bid Patient not taking: Reported on 12/17/2017 11/19/17   Anders Simmonds, PA-C  naproxen (NAPROSYN) 500 MG tablet Take 1 tablet (500 mg total) by mouth 2 (two) times daily with a meal. Patient not taking: Reported on 12/17/2017 11/19/17   Anders Simmonds, PA-C  sulfamethoxazole-trimethoprim (BACTRIM DS,SEPTRA DS) 800-160 MG tablet Take 1 tablet by mouth 2 (two) times daily. 12/17/17   Cain Saupe, MD    Family History Family History  Problem Relation Age of Onset  . Arthritis Mother   . Cancer Mother   . Arthritis Father   .  Cancer Father   . Diabetes Father     Social History Social History   Tobacco Use  . Smoking status: Never Smoker  . Smokeless tobacco: Never Used  Substance Use Topics  . Alcohol use: Yes    Alcohol/week: 2.0 standard drinks    Types: 2 Cans of beer per week  . Drug use: Yes    Frequency: 3.0 times per week    Types: Marijuana     Allergies   Patient has no known allergies.   Review of Systems Review of Systems  Constitutional: Negative for fever.  HENT: Negative for congestion.   Respiratory: Negative for cough.   Cardiovascular: Negative for chest pain.  Gastrointestinal: Negative for abdominal pain.  Musculoskeletal: Positive for arthralgias.  Skin: Negative for color change.  Neurological: Negative for weakness.  Hematological: Negative for adenopathy.     Physical Exam Triage Vital Signs ED Triage Vitals  Enc Vitals Group     BP 12/28/18 2004 (!) 145/81     Pulse Rate 12/28/18 2004 64     Resp 12/28/18 2004 16     Temp 12/28/18 2004 98.7 F (37.1 C)     Temp Source 12/28/18 2004 Temporal     SpO2 12/28/18 2004 100 %     Weight --  Height --      Head Circumference --      Peak Flow --      Pain Score 12/28/18 2002 0     Pain Loc --      Pain Edu? --      Excl. in [place]? --    No data found.  Updated Vital Signs BP (!) 145/81 (BP Location: Right Arm)   Pulse 64   Temp 98.7 F (37.1 C) (Temporal)   Resp 16   SpO2 100%   Visual Acuity Right Eye Distance:   Left Eye Distance:   Bilateral Distance:    Right Eye Near:   Left Eye Near:    Bilateral Near:     Physical Exam Gen: NAD, alert, cooperative with exam, well-appearing ENT: normal lips, normal nasal mucosa,  Eye: normal EOM, normal conjunctiva and lids CV:  no edema, +2 pedal pulses   Resp: no accessory muscle use, non-labored,  Skin: no rashes, no areas of induration  Neuro: normal tone, normal sensation to touch Psych:  normal insight, alert and oriented MSK:  Left wrist:   Normal range of motion. Normal strength resistance. Normal grip strength. Normal strength resistance with finger abduction and abduction. Some tenderness to palpation of the distal radius. Negative clunk. Normal strength resistance with thumb extension. Normal pincer grasp. Neurovascular intact  UC Treatments / Results  Labs (all labs ordered are listed, but only abnormal results are displayed) Labs Reviewed - No data to display  EKG   Radiology No results found.  Procedures Procedures (including critical care time)  Medications Ordered in UC Medications - No data to display  Initial Impression / Assessment and Plan / UC Course  I have reviewed the triage vital signs and the nursing notes.  Pertinent labs & imaging results that were available during my care of the patient were reviewed by me and considered in my medical decision making (see chart for details).     [name] is a 38 year old male that is presenting with left dorsal wrist pain.  He has a history of rheumatoid arthritis and unclear if he is taking any medication for that.  Could be associated with RA but no significant swelling. Possible for sprain.  No inciting event.  Will provide with ibuprofen.  Counseled on home exercise therapy and supportive care.  Provided work note.  Given occasions follow-up return.  Final Clinical Impressions(s) / UC Diagnoses   Final diagnoses:  Left wrist pain     Discharge Instructions     Please try ice  Please try ibuprofen  Please try the range of motion exercises      ED Prescriptions    Medication Sig Dispense Auth. Provider   ibuprofen (ADVIL) 600 MG tablet Take 1 tablet (600 mg total) by mouth every 6 (six) hours as needed. 30 tablet Rosemarie Ax, MD     Controlled Substance Prescriptions [place] Controlled Substance Registry consulted? Not Applicable   Rosemarie Ax, MD 12/28/18 2126

## 2018-12-28 NOTE — ED Triage Notes (Signed)
Patient presents to Urgent Care with complaints of left wrist pain since this morning . Patient reports he slept on his arm funny all night and thinks he cut off circulation, needs work note.

## 2018-12-29 MED FILL — IBUPROFEN 600 MG TABLET: 600 | 7 days supply | Qty: 30 | Fill #0

## 2019-01-14 ENCOUNTER — Encounter (HOSPITAL_COMMUNITY): Payer: Self-pay

## 2019-01-14 ENCOUNTER — Ambulatory Visit (HOSPITAL_COMMUNITY)
Admission: EM | Admit: 2019-01-14 | Discharge: 2019-01-14 | Disposition: A | Discharge status: Home / Self Care | Payer: BC Managed Care – PPO | Attending: Family Medicine | Admitting: Family Medicine

## 2019-01-14 ENCOUNTER — Other Ambulatory Visit: Payer: Self-pay

## 2019-01-14 DIAGNOSIS — R2231 Localized swelling, mass and lump, right upper limb: Secondary | ICD-10-CM

## 2019-01-14 DIAGNOSIS — M351 Other overlap syndromes: Secondary | ICD-10-CM

## 2019-01-14 DIAGNOSIS — R2232 Localized swelling, mass and lump, left upper limb: Secondary | ICD-10-CM

## 2019-01-14 MED ORDER — PREDNISONE 10 MG (21) PO TBPK: ORAL_TABLET | Freq: Every day | ORAL | 0 refills | Status: DC

## 2019-01-14 MED FILL — predniSONE 10 MG TABS: 10 | 6 days supply | Qty: 21 | Fill #0

## 2019-01-14 NOTE — ED Triage Notes (Signed)
Patient presents to Urgent Care with complaints of two fingers on his right and swelling up since about 2-3 days ago. Patient reports this happens every now and then and they ache.

## 2019-01-19 NOTE — ED Provider Notes (Signed)
[facility]   [id number] 01/14/19 Arrival Time: 3710  ASSESSMENT & PLAN:  1. Mixed connective tissue disease ([place])   2. Localized swelling of finger of both hands     No indication for imaging at this time. Acute on chronic problem.  To begin: Meds ordered this encounter  Medications  . predniSONE (STERAPRED UNI-PAK 21 TAB) 10 MG (21) TBPK tablet    Sig: Take by mouth daily. Take as directed.    Dispense:  21 tablet    Refill:  0     Recommend ortho f/u.  Reviewed expectations re: course of current medical issues. Questions answered. Outlined signs and symptoms indicating need for more acute intervention. Patient verbalized understanding. After Visit Summary given.  SUBJECTIVE: History from: patient. [name] is a 37 y.o. male who reports fairly persistent moderate pain of his right 2/3 fingers; chronic problem; acute exacerbation; some aching; non-radiating. Onset: gradual. First noted: a week ago. Injury/trama: no. Symptoms have progressed to a point and plateaued since beginning. Aggravating factors: certain movements and grasping. Alleviating factors: have not been identified. Associated symptoms: none reported. Extremity sensation changes or weakness: none. Self treatment: tried OTCs without relief of pain. History of similar: yes.  History reviewed. No pertinent surgical history.   ROS: As per HPI. All other systems negative.    OBJECTIVE:  Vitals:   01/14/19 1625  BP: (!) 131/58  Pulse: 69  Resp: 17  Temp: 98.4 F (36.9 C)  TempSrc: Oral  SpO2: 100%    General appearance: alert; no distress HEENT: [place]; AT Neck: supple with FROM Resp: unlabored respirations Extremities: . RUE: warm and well perfused; poorly localized mild tenderness over right 2/3 fingers; without gross deformities; swelling: minimal; bruising: none; ROM: normal with reported discomfort CV: brisk extremity capillary refill of RUE; 2+ radial pulse of RUE. Skin:  warm and dry; no visible rashes Neurologic: gait normal; normal reflexes of RUE; normal sensation of RUE; normal strength of RUE Psychological: alert and cooperative; normal mood and affect     No Known Allergies  Past Medical History:  Diagnosis Date  . Gout   . NGU (nongonococcal urethritis)   . Skin infection    Social History   Socioeconomic History  . Marital status: Single    Spouse name: Not on file  . Number of children: Not on file  . Years of education: Not on file  . Highest education level: Not on file  Occupational History  . Not on file  Social Needs  . Financial resource strain: Not on file  . Food insecurity    Worry: Not on file    Inability: Not on file  . Transportation needs    Medical: Not on file    Non-medical: Not on file  Tobacco Use  . Smoking status: Never Smoker  . Smokeless tobacco: Never Used  Substance and Sexual Activity  . Alcohol use: Yes    Alcohol/week: 2.0 standard drinks    Types: 2 Cans of beer per week  . Drug use: Yes    Frequency: 3.0 times per week    Types: Marijuana  . Sexual activity: Yes    Birth control/protection: Condom  Lifestyle  . Physical activity    Days per week: Not on file    Minutes per session: Not on file  . Stress: Not on file  Relationships  . Social Herbalist on phone: Not on file    Gets together: Not on file  Attends religious service: Not on file    Active member of club or organization: Not on file    Attends meetings of clubs or organizations: Not on file    Relationship status: Not on file  Other Topics Concern  . Not on file  Social History Narrative  . Not on file   Family History  Problem Relation Age of Onset  . Arthritis Mother   . Cancer Mother   . Arthritis Father   . Cancer Father   . Diabetes Father    History reviewed. No pertinent surgical history.    Mardella Layman, MD 01/19/19 [phone]

## 2019-04-01 ENCOUNTER — Ambulatory Visit (HOSPITAL_COMMUNITY)
Admission: EM | Admit: 2019-04-01 | Discharge: 2019-04-01 | Disposition: A | Discharge status: Home / Self Care | Payer: BC Managed Care – PPO | Attending: Family Medicine | Admitting: Family Medicine

## 2019-04-01 ENCOUNTER — Other Ambulatory Visit: Payer: Self-pay

## 2019-04-01 ENCOUNTER — Encounter (HOSPITAL_COMMUNITY): Payer: Self-pay

## 2019-04-01 DIAGNOSIS — L02415 Cutaneous abscess of right lower limb: Secondary | ICD-10-CM

## 2019-04-01 DIAGNOSIS — L0291 Cutaneous abscess, unspecified: Secondary | ICD-10-CM | POA: Insufficient documentation

## 2019-04-01 MED ORDER — SULFAMETHOXAZOLE-TRIMETHOPRIM 800-160 MG PO TABS: 1.0000 | ORAL_TABLET | Freq: Two times a day (BID) | ORAL | 0 refills | Status: AC

## 2019-04-01 MED ORDER — IBUPROFEN 800 MG PO TABS: 800.0000 mg | ORAL_TABLET | Freq: Three times a day (TID) | ORAL | 0 refills | Status: DC

## 2019-04-01 MED ORDER — SULFAMETHOXAZOLE-TRIMETHOPRIM 800-160 MG PO TABS: 1.0000 | ORAL_TABLET | Freq: Two times a day (BID) | ORAL | 0 refills | Status: DC

## 2019-04-01 MED ORDER — CEFTRIAXONE SODIUM 1 G IJ SOLR
INTRAMUSCULAR | Status: AC
  Filled 2019-04-01: qty 10

## 2019-04-01 MED ORDER — HYDROCODONE-ACETAMINOPHEN 7.5-325 MG PO TABS: 1.0000 | ORAL_TABLET | Freq: Four times a day (QID) | ORAL | 0 refills | Status: DC | PRN

## 2019-04-01 MED ORDER — CEFTRIAXONE SODIUM 1 G IJ SOLR
1.0000 g | Freq: Once | INTRAMUSCULAR | Status: AC
  Administered 2019-04-01: 20:00:00 1 g via INTRAMUSCULAR

## 2019-04-01 NOTE — ED Provider Notes (Signed)
[facility]    CSN: [id number] Arrival date & time: 04/01/19  1844      History   Chief Complaint Chief Complaint  Patient presents with  . Insect Bite    HPI [name] is a 38 y.o. male.   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  He has an infection in his right thigh that is getting larger and more painful over time.  Its been present for over 3 days.  He states the pain is terrible.  It is red, hot, firm to touch, and has an open area in the middle with a blister on top.  He thinks it might be a spider bite.  He has had trouble with skin infections before.  No fever or chills.  No body aches.  Past Medical History:  Diagnosis Date  . Gout   . NGU (nongonococcal urethritis)   . Skin infection     Patient Active Problem List   Diagnosis Date Noted  . Rheumatoid arthritis(714.0) 09/24/2010  . Connective tissue disease (HCC) 09/24/2010  . skin infection, bilateral hands 09/05/2010    History reviewed. No pertinent surgical history.     Home Medications    Prior to Admission medications   Medication Sig Start Date End Date Taking? Authorizing Provider  HYDROcodone-acetaminophen (NORCO) 7.5-325 MG tablet Take 1 tablet by mouth every 6 (six) hours as needed for moderate pain. 04/01/19   Eustace Moore, MD  ibuprofen (ADVIL) 800 MG tablet Take 1 tablet (800 mg total) by mouth 3 (three) times daily. 04/01/19   Eustace Moore, MD  sulfamethoxazole-trimethoprim (BACTRIM DS) 800-160 MG tablet Take 1 tablet by mouth 2 (two) times daily for 10 days. 04/01/19 04/11/19  Eustace Moore, MD    Family History Family History  Problem Relation Age of Onset  . Arthritis Mother   . Cancer Mother   . Arthritis Father   . Cancer Father   . Diabetes Father     Social History Social History   Tobacco Use  . Smoking status: Never Smoker  . Smokeless tobacco: Never Used  Substance Use Topics  . Alcohol  use: Yes    Alcohol/week: 2.0 standard drinks    Types: 2 Cans of beer per week  . Drug use: Yes    Frequency: 3.0 times per week    Types: Marijuana     Allergies   Patient has no known allergies.   Review of Systems Review of Systems  Constitutional: Negative for chills and fever.  HENT: Negative for congestion and hearing loss.   Eyes: Negative for pain.  Respiratory: Negative for cough and shortness of breath.   Cardiovascular: Negative for chest pain and leg swelling.  Gastrointestinal: Negative for abdominal pain, constipation and diarrhea.  Genitourinary: Negative for dysuria and frequency.  Musculoskeletal: Negative for myalgias.  Skin: Positive for wound.  Neurological: Negative for dizziness, seizures and headaches.  Psychiatric/Behavioral: The patient is not nervous/anxious.      Physical Exam Triage Vital Signs ED Triage Vitals  Enc Vitals Group     BP 04/01/19 1904 (!) 145/80     Pulse Rate 04/01/19 1904 99     Resp 04/01/19 1904 18     Temp 04/01/19 1904 98.5 F (36.9 C)     Temp Source 04/01/19 1904 Oral     SpO2 04/01/19 1904 100 %     Weight 04/01/19 1901 250 lb (113.4  kg)     Height --      Head Circumference --      Peak Flow --      Pain Score 04/01/19 1901 8     Pain Loc --      Pain Edu? --      Excl. in GC? --    No data found.  Updated Vital Signs BP (!) 145/80 (BP Location: Right Arm)   Pulse 99   Temp 98.5 F (36.9 C) (Oral)   Resp 18   Wt 113.4 kg   SpO2 100%   BMI 31.25 kg/m  Physical Exam Constitutional:      General: He is not in acute distress.    Appearance: He is well-developed.  HENT:     Head: Normocephalic and atraumatic.     Mouth/Throat:     Comments: Mask in place Eyes:     Conjunctiva/sclera: Conjunctivae normal.     Pupils: Pupils are equal, round, and reactive to light.  Cardiovascular:     Rate and Rhythm: Normal rate.  Pulmonary:     Effort: Pulmonary effort is normal. No respiratory distress.    Abdominal:     General: There is no distension.     Palpations: Abdomen is soft.  Musculoskeletal:        General: Normal range of motion.     Cervical back: Normal range of motion.  Skin:    General: Skin is warm and dry.     Comments: Middle of right thigh, lateral aspect, there is an indurated, erythematous, palpable tender good infection that measures 10 cm across.  Overlying skin has a peau d'orange appearance.  Centrally there is a dark vesicle, that when opened with an 18-gauge needle drains brownish purulent material.  This is cultured.  Neurological:     Mental Status: He is alert.  Psychiatric:        Mood and Affect: Mood normal.        Behavior: Behavior normal.      UC Treatments / Results  Labs (all labs ordered are listed, but only abnormal results are displayed) Labs Reviewed  AEROBIC CULTURE (SUPERFICIAL SPECIMEN)    EKG   Radiology No results found.  Procedures Procedures (including critical care time)  Medications Ordered in UC Medications  cefTRIAXone (ROCEPHIN) injection 1 g (has no administration in time range)    Initial Impression / Assessment and Plan / UC Course  I have reviewed the triage vital signs and the nursing notes.  Pertinent labs & imaging results that were available during my care of the patient were reviewed by me and considered in my medical decision making (see chart for details).     This is an abscess with a large area of overlying cellulitis.  Very painful.  I am going to start him with a gram of Rocephin IM.  He is then going to take 10 days of sulfamethoxazole twice daily.  I am giving him ibuprofen to take while at work and hydrocodone to take while at home.  He knows to return promptly if he is worse at any time instead of better Final Clinical Impressions(s) / UC Diagnoses   Final diagnoses:  Abscess     Discharge Instructions     Go home and Soak in a bath Try to express the pus from your wound, press  gently Take antibiotic 2 times a day Take ibuprofen 3 times a day with food Take pain medicine in addition if needed Do  not take pain medicine and drive Expect improvement over the next couple of days Return if worse at any time instead of better   ED Prescriptions    Medication Sig Dispense Auth. Provider   sulfamethoxazole-trimethoprim (BACTRIM DS) 800-160 MG tablet Take 1 tablet by mouth 2 (two) times daily for 10 days. 20 tablet Raylene Everts, MD   HYDROcodone-acetaminophen [facility]) 7.5-325 MG tablet Take 1 tablet by mouth every 6 (six) hours as needed for moderate pain. 15 tablet Raylene Everts, MD   ibuprofen (ADVIL) 800 MG tablet Take 1 tablet (800 mg total) by mouth 3 (three) times daily. 21 tablet Raylene Everts, MD     I have reviewed the PDMP during this encounter.   Raylene Everts, MD 04/01/19 [phone]

## 2019-04-01 NOTE — Discharge Instructions (Signed)
Go home and Soak in a bath Try to express the pus from your wound, press gently Take antibiotic 2 times a day Take ibuprofen 3 times a day with food Take pain medicine in addition if needed Do not take pain medicine and drive Expect improvement over the next couple of days Return if worse at any time instead of better

## 2019-04-01 NOTE — ED Triage Notes (Addendum)
Pt states  he has a spider bite right thigh ( outer thigh )  its been 3 days. It's swelling and throbbing it's red and warm to the touch. Pt states it's very tender to the touch.

## 2019-04-04 LAB — AEROBIC CULTURE W GRAM STAIN (SUPERFICIAL SPECIMEN): Special Requests: NORMAL

## 2019-04-05 ENCOUNTER — Telehealth (HOSPITAL_COMMUNITY): Payer: Self-pay | Admitting: Emergency Medicine

## 2019-04-05 NOTE — Telephone Encounter (Signed)
Would culture was positive for staph aureus, pt was treated with bactrim. Patient contacted by phone and made aware of    results. Pt verbalized understanding and had all questions answered.  States the wound on his leg is healing very well, and will follow up for wound recheck if needed.

## 2019-07-16 ENCOUNTER — Ambulatory Visit (HOSPITAL_COMMUNITY)
Admission: EM | Admit: 2019-07-16 | Discharge: 2019-07-16 | Discharge status: Against Medical Advice | Payer: BC Managed Care – PPO

## 2020-01-22 IMAGING — CR DG FOOT COMPLETE 3+V*L*
3 series · 3 of 3 positions shown · non-contrast
Comparison: None.

CLINICAL DATA: 36-year-old male with left foot wound for 1 month.

EXAM:
LEFT FOOT - COMPLETE 3+ VIEW

[x foot ap left]
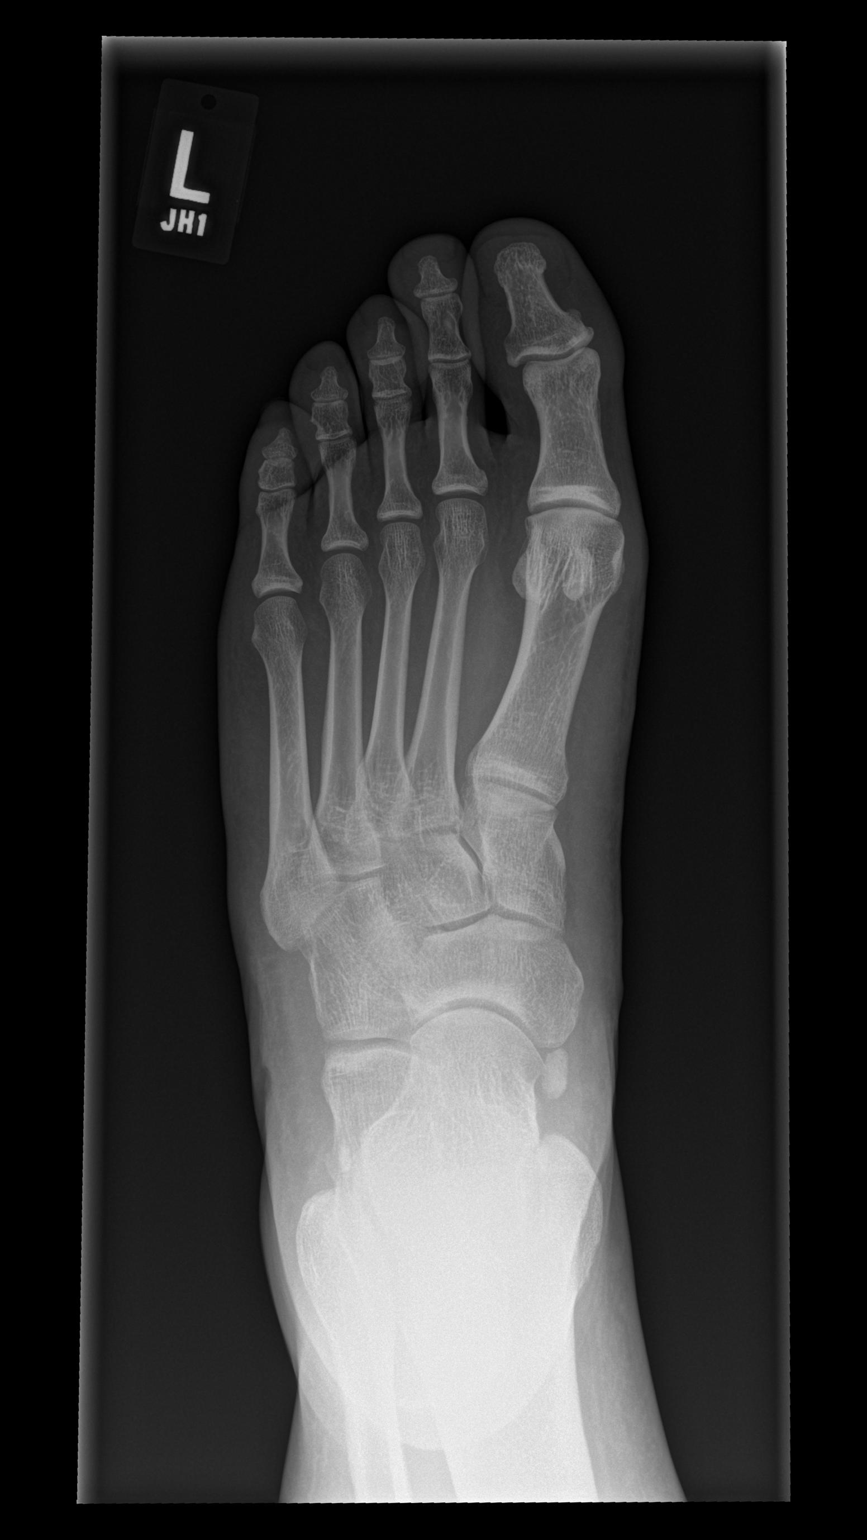

[x foot obl left]
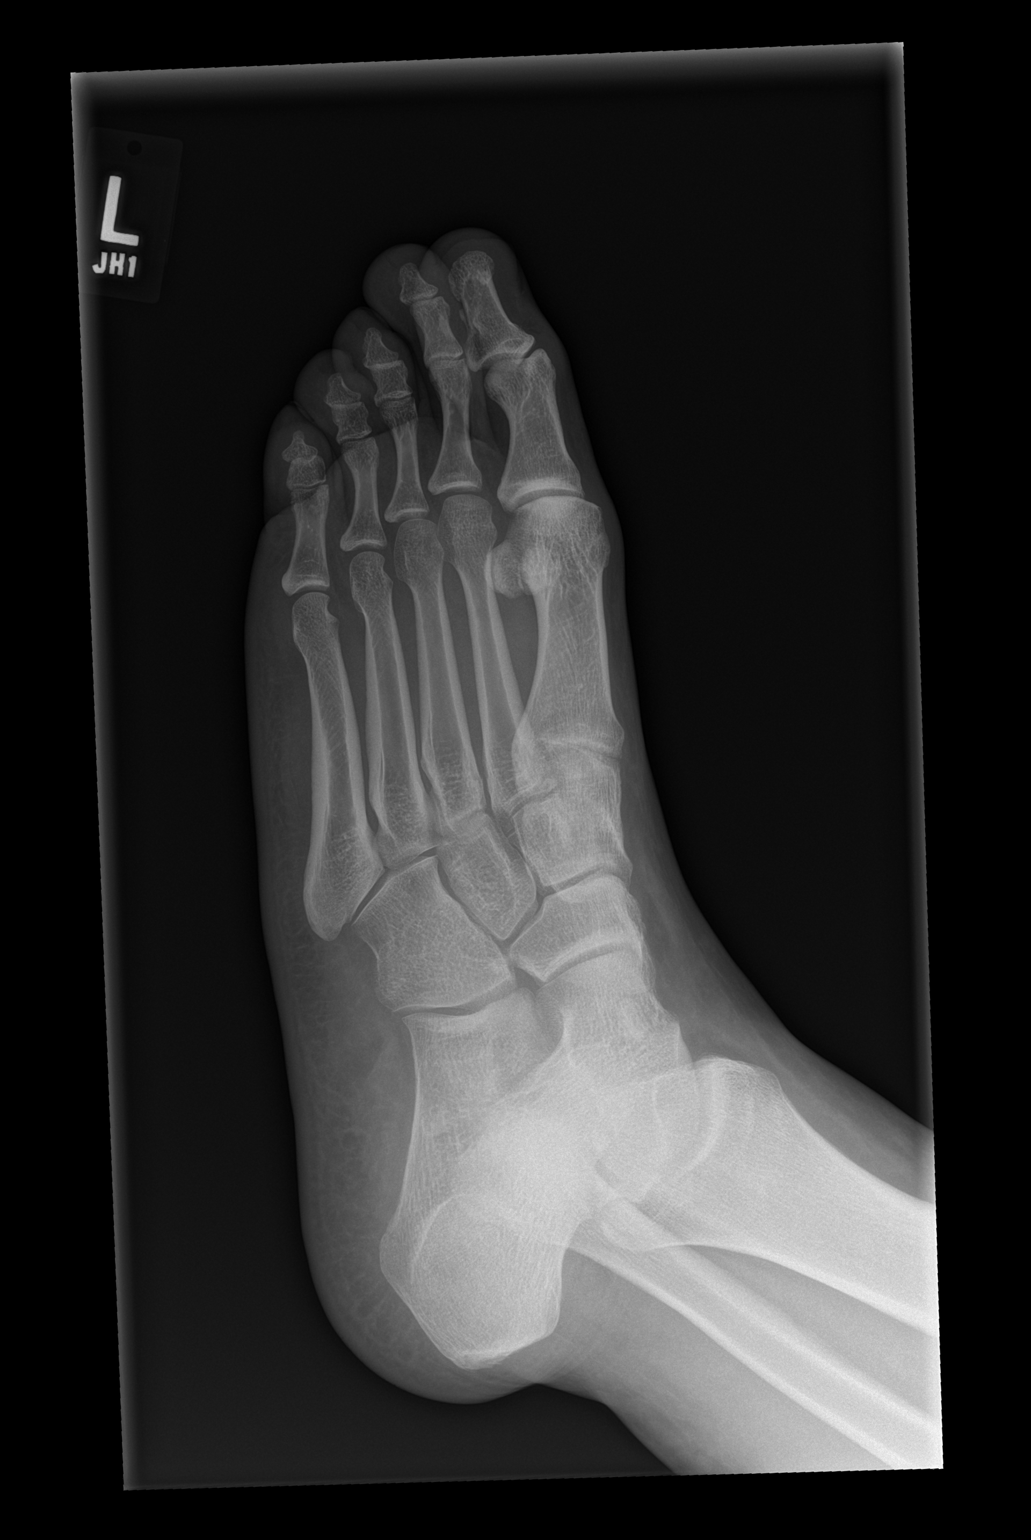

[x foot lat left]
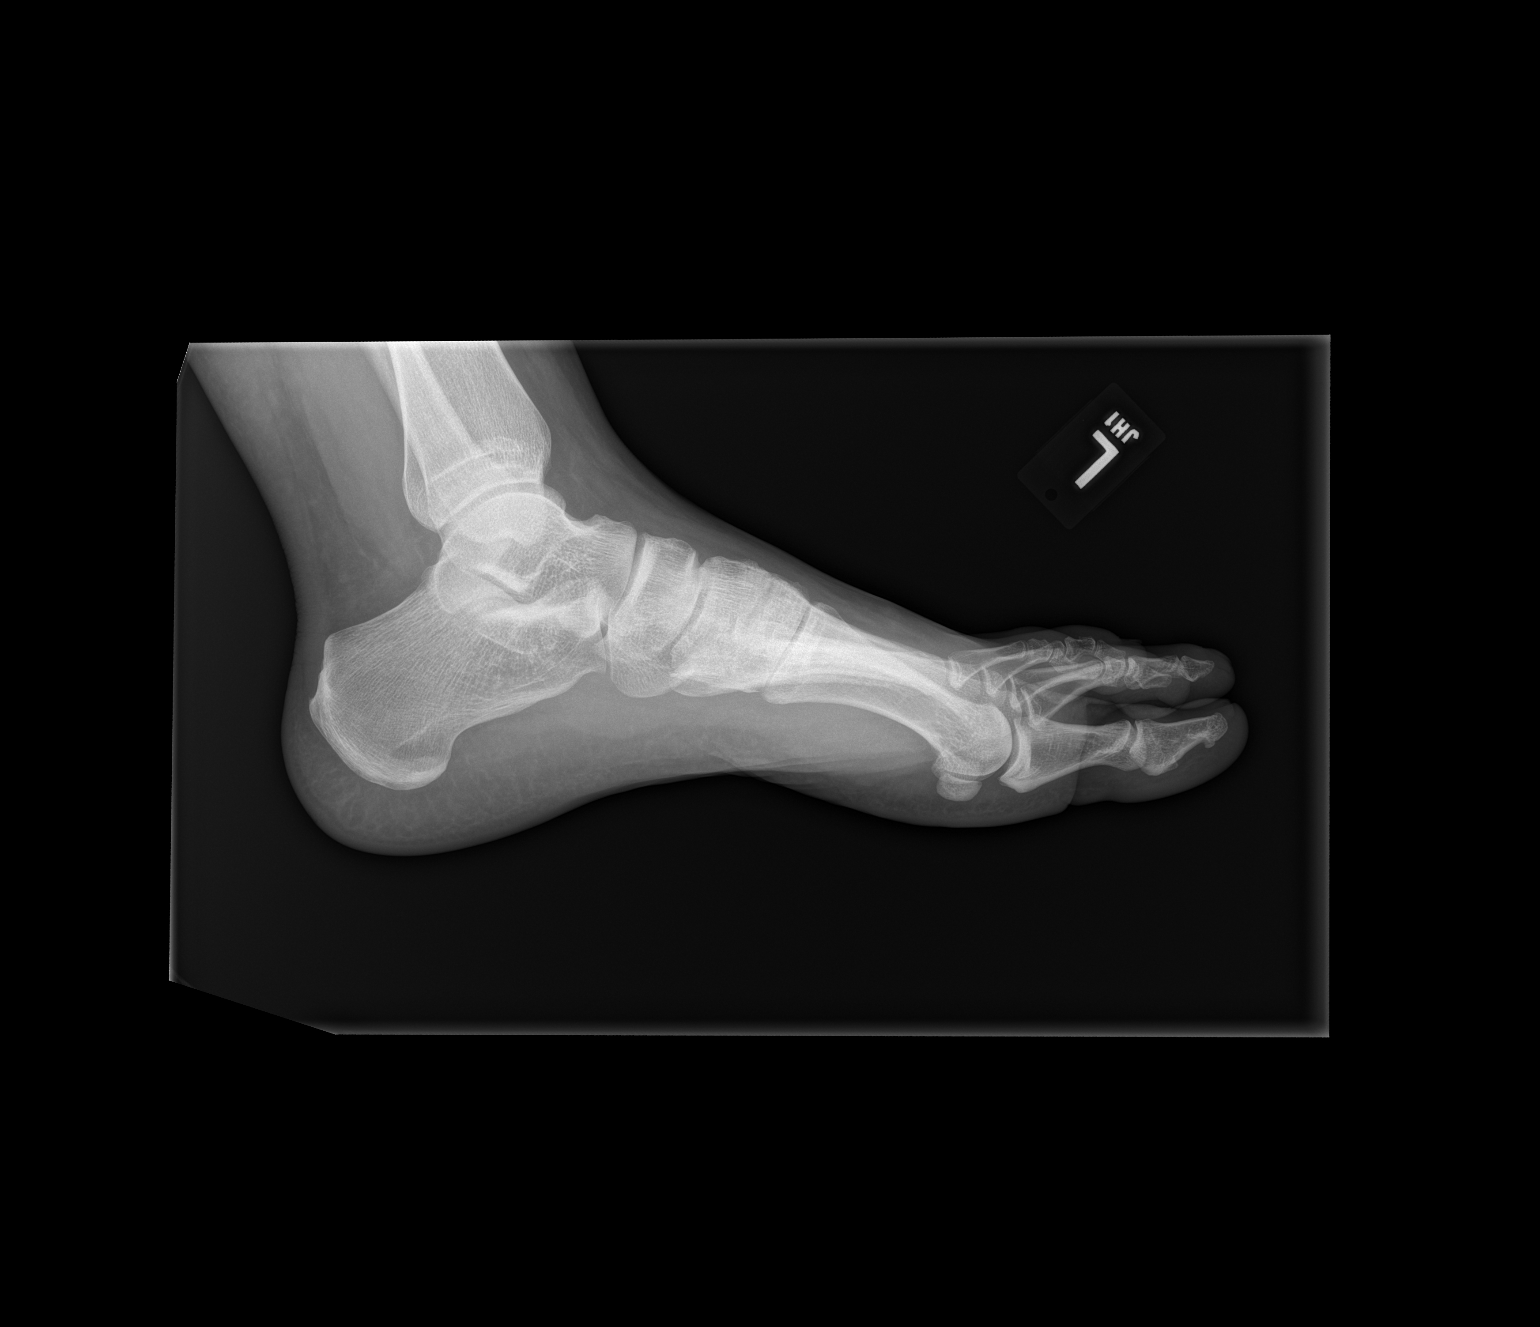

[3 of 3 positions shown; findings below may reference images not displayed]

FINDINGS: There is no acute fracture or dislocation. The bones are well
mineralized. No arthritic changes. No bone erosion or periosteal
elevation. Focal irregularity of the skin of the lateral aspect of
the hindfoot corresponds to the known wound.
IMPRESSION: Skin wound in the lateral hindfoot. No evidence of osteomyelitis by
radiograph.

## 2020-07-04 ENCOUNTER — Other Ambulatory Visit: Payer: Self-pay

## 2020-07-04 ENCOUNTER — Ambulatory Visit (HOSPITAL_COMMUNITY)
Admission: EM | Admit: 2020-07-04 | Discharge: 2020-07-04 | Disposition: A | Discharge status: Home / Self Care | Payer: BC Managed Care – PPO | Attending: Emergency Medicine | Admitting: Emergency Medicine

## 2020-07-04 ENCOUNTER — Encounter (HOSPITAL_COMMUNITY): Payer: Self-pay | Admitting: *Deleted

## 2020-07-04 DIAGNOSIS — M5441 Lumbago with sciatica, right side: Secondary | ICD-10-CM

## 2020-07-04 MED ORDER — CYCLOBENZAPRINE HCL 10 MG PO TABS: 10.0000 mg | ORAL_TABLET | Freq: Two times a day (BID) | ORAL | 0 refills | Status: DC | PRN

## 2020-07-04 MED ORDER — IBUPROFEN 800 MG PO TABS: 800.0000 mg | ORAL_TABLET | Freq: Three times a day (TID) | ORAL | 0 refills | Status: DC | PRN

## 2020-07-04 NOTE — ED Provider Notes (Signed)
[facility]    CSN: [id number] Arrival date & time: 07/04/20  0802      History   Chief Complaint Chief Complaint  Patient presents with  . Back Pain    HPI [name] is a 40 y.o. male.   Patient presents with right lower back pain which radiates to his right thigh x2 days.  No falls or injury.  He states he helped his sister move last week and this may have contributed to his pain.  He denies numbness, weakness, saddle anesthesia, loss of bowel/bladder control, bruising, redness, wounds, abdominal pain, dysuria, hematuria, or other symptoms.  No treatments attempted at home.  His medical history includes rheumatoid arthritis and gout.  The history is provided by the patient and medical records.    Past Medical History:  Diagnosis Date  . Gout   . NGU (nongonococcal urethritis)   . Skin infection     Patient Active Problem List   Diagnosis Date Noted  . Rheumatoid arthritis(714.0) 09/24/2010  . Connective tissue disease (HCC) 09/24/2010  . skin infection, bilateral hands 09/05/2010    History reviewed. No pertinent surgical history.     Home Medications    Prior to Admission medications   Medication Sig Start Date End Date Taking? Authorizing Provider  cyclobenzaprine (FLEXERIL) 10 MG tablet Take 1 tablet (10 mg total) by mouth 2 (two) times daily as needed for muscle spasms. 07/04/20  Yes Mickie Bail, NP  ibuprofen (ADVIL) 800 MG tablet Take 1 tablet (800 mg total) by mouth every 8 (eight) hours as needed. 07/04/20  Yes Mickie Bail, NP    Family History Family History  Problem Relation Age of Onset  . Arthritis Mother   . Cancer Mother   . Arthritis Father   . Cancer Father   . Diabetes Father     Social History Social History   Tobacco Use  . Smoking status: Never Smoker  . Smokeless tobacco: Never Used  Vaping Use  . Vaping Use: Never used  Substance Use Topics  . Alcohol use: Yes    Alcohol/week: 2.0 standard drinks    Types:  2 Cans of beer per week  . Drug use: Yes    Frequency: 3.0 times per week    Types: Marijuana     Allergies   Other   Review of Systems Review of Systems  Constitutional: Negative for chills and fever.  HENT: Negative for ear pain and sore throat.   Eyes: Negative for pain and visual disturbance.  Respiratory: Negative for cough and shortness of breath.   Cardiovascular: Negative for chest pain and palpitations.  Gastrointestinal: Negative for abdominal pain and vomiting.  Genitourinary: Negative for dysuria and hematuria.  Musculoskeletal: Positive for back pain. Negative for arthralgias and gait problem.  Skin: Negative for color change and rash.  Neurological: Negative for syncope, weakness and numbness.  All other systems reviewed and are negative.    Physical Exam Triage Vital Signs ED Triage Vitals  Enc Vitals Group     BP      Pulse      Resp      Temp      Temp src      SpO2      Weight      Height      Head Circumference      Peak Flow      Pain Score      Pain Loc  Pain Edu?      Excl. in GC?    No data found.  Updated Vital Signs BP (!) 152/86 (BP Location: Right Arm)   Pulse 72   Temp 98.2 F (36.8 C) (Oral)   Resp 16   SpO2 100%   Visual Acuity Right Eye Distance:   Left Eye Distance:   Bilateral Distance:    Right Eye Near:   Left Eye Near:    Bilateral Near:     Physical Exam Vitals and nursing note reviewed.  Constitutional:      General: He is not in acute distress.    Appearance: He is well-developed. He is not ill-appearing.  HENT:     Head: Normocephalic and atraumatic.     Mouth/Throat:     Mouth: Mucous membranes are moist.  Eyes:     Conjunctiva/sclera: Conjunctivae normal.  Cardiovascular:     Rate and Rhythm: Normal rate and regular rhythm.     Heart sounds: Normal heart sounds.  Pulmonary:     Effort: Pulmonary effort is normal. No respiratory distress.     Breath sounds: Normal breath sounds.  Abdominal:      Palpations: Abdomen is soft.     Tenderness: There is no abdominal tenderness. There is no guarding or rebound.  Musculoskeletal:        General: Tenderness present. No swelling or deformity. Normal range of motion.     Cervical back: Neck supple.       Back:  Skin:    General: Skin is warm and dry.     Findings: No bruising, erythema, lesion or rash.  Neurological:     General: No focal deficit present.     Mental Status: He is alert and oriented to person, place, and time.     Sensory: No sensory deficit.     Motor: No weakness.     Gait: Gait normal.  Psychiatric:        Mood and Affect: Mood normal.        Behavior: Behavior normal.      UC Treatments / Results  Labs (all labs ordered are listed, but only abnormal results are displayed) Labs Reviewed - No data to display  EKG   Radiology No results found.  Procedures Procedures (including critical care time)  Medications Ordered in UC Medications - No data to display  Initial Impression / Assessment and Plan / UC Course  I have reviewed the triage vital signs and the nursing notes.  Pertinent labs & imaging results that were available during my care of the patient were reviewed by me and considered in my medical decision making (see chart for details).   Acute right lower back pain with right-sided sciatica.  Treating with ibuprofen and Flexeril.  Precautions for drowsiness with Flexeril discussed.  Instructed patient to follow-up with orthopedics if his symptoms are not improving.  He agrees to plan of care.   Final Clinical Impressions(s) / UC Diagnoses   Final diagnoses:  Acute right-sided low back pain with right-sided sciatica     Discharge Instructions     Take ibuprofen as needed for discomfort.  Take the muscle relaxer as needed for muscle spasm; Do not drive, operate machinery, or drink alcohol with this medication as it can cause drowsiness.   Follow up with your primary care provider or  an orthopedist if your symptoms are not improving.        ED Prescriptions    Medication Sig Dispense Auth. Provider  ibuprofen (ADVIL) 800 MG tablet Take 1 tablet (800 mg total) by mouth every 8 (eight) hours as needed. 21 tablet Mickie Bail, NP   cyclobenzaprine (FLEXERIL) 10 MG tablet Take 1 tablet (10 mg total) by mouth 2 (two) times daily as needed for muscle spasms. 20 tablet Mickie Bail, NP     I have reviewed the PDMP during this encounter.   Mickie Bail, NP 07/04/20 [phone]

## 2020-07-04 NOTE — Discharge Instructions (Addendum)
Take ibuprofen as needed for discomfort.  Take the muscle relaxer as needed for muscle spasm; Do not drive, operate machinery, or drink alcohol with this medication as it can cause drowsiness.   Follow up with your primary care provider or an orthopedist if your symptoms are not improving.     

## 2020-07-04 NOTE — ED Triage Notes (Signed)
Pt reports Rt lower back pain for 2 days.

## 2020-07-13 ENCOUNTER — Encounter (HOSPITAL_COMMUNITY): Payer: Self-pay

## 2020-07-13 ENCOUNTER — Other Ambulatory Visit: Payer: Self-pay

## 2020-07-13 ENCOUNTER — Ambulatory Visit (HOSPITAL_COMMUNITY)
Admission: EM | Admit: 2020-07-13 | Discharge: 2020-07-13 | Disposition: A | Discharge status: Home / Self Care | Payer: BC Managed Care – PPO | Attending: Emergency Medicine | Admitting: Emergency Medicine

## 2020-07-13 ENCOUNTER — Other Ambulatory Visit: Payer: Self-pay | Admitting: Emergency Medicine

## 2020-07-13 DIAGNOSIS — K029 Dental caries, unspecified: Secondary | ICD-10-CM | POA: Diagnosis not present

## 2020-07-13 DIAGNOSIS — K047 Periapical abscess without sinus: Secondary | ICD-10-CM | POA: Diagnosis not present

## 2020-07-13 MED ORDER — AMOXICILLIN 875 MG PO TABS: 875.0000 mg | ORAL_TABLET | Freq: Two times a day (BID) | ORAL | 0 refills | Status: AC

## 2020-07-13 MED FILL — AMOXICILLIN 875 MG TABS: 875 | 7 days supply | Qty: 14 | Fill #0

## 2020-07-13 NOTE — Discharge Instructions (Signed)
Take the antibiotic as prescribed.    A dental resource guide is attached.  Please call to make an appointment with a dentist as soon as possible.    Go to the emergency department if you have acute worsening symptoms.     

## 2020-07-13 NOTE — ED Provider Notes (Signed)
[facility]    CSN: [id number] Arrival date & time: 07/13/20  1132      History   Chief Complaint Chief Complaint  Patient presents with  . Dental Pain  . Facial Swelling    HPI [name] is a 40 y.o. male.   Patient presents with left upper toothache and left side facial swelling x2 days.  He states he has attempted to schedule an appointment with a dentist without success.  He denies fever, chills, difficulty swallowing, or other symptoms.  Treatment at home with ibuprofen.  Patient was seen here on 07/04/2020; diagnosed with acute low back pain with sciatica; with ibuprofen and Flexeril.  His medical history includes rheumatoid arthritis and gout.  The history is provided by the patient and medical records.    Past Medical History:  Diagnosis Date  . Gout   . NGU (nongonococcal urethritis)   . Skin infection     Patient Active Problem List   Diagnosis Date Noted  . Rheumatoid arthritis(714.0) 09/24/2010  . Connective tissue disease (HCC) 09/24/2010  . skin infection, bilateral hands 09/05/2010    History reviewed. No pertinent surgical history.     Home Medications    Prior to Admission medications   Medication Sig Start Date End Date Taking? Authorizing Provider  amoxicillin (AMOXIL) 875 MG tablet Take 1 tablet (875 mg total) by mouth 2 (two) times daily for 7 days. 07/13/20 07/20/20 Yes Mickie Bail, NP  cyclobenzaprine (FLEXERIL) 10 MG tablet Take 1 tablet (10 mg total) by mouth 2 (two) times daily as needed for muscle spasms. 07/04/20   Mickie Bail, NP  ibuprofen (ADVIL) 800 MG tablet Take 1 tablet (800 mg total) by mouth every 8 (eight) hours as needed. 07/04/20   Mickie Bail, NP    Family History Family History  Problem Relation Age of Onset  . Arthritis Mother   . Cancer Mother   . Arthritis Father   . Cancer Father   . Diabetes Father     Social History Social History   Tobacco Use  . Smoking status: Never Smoker  . Smokeless  tobacco: Never Used  Vaping Use  . Vaping Use: Never used  Substance Use Topics  . Alcohol use: Yes    Alcohol/week: 2.0 standard drinks    Types: 2 Cans of beer per week  . Drug use: Yes    Frequency: 3.0 times per week    Types: Marijuana     Allergies   Other   Review of Systems Review of Systems  Constitutional: Negative for chills and fever.  HENT: Positive for dental problem and facial swelling. Negative for ear pain, sore throat, trouble swallowing and voice change.   Eyes: Negative for pain and visual disturbance.  Respiratory: Negative for cough and shortness of breath.   Cardiovascular: Negative for chest pain and palpitations.  Gastrointestinal: Negative for abdominal pain and vomiting.  Genitourinary: Negative for dysuria and hematuria.  Musculoskeletal: Negative for arthralgias and back pain.  Skin: Negative for color change and rash.  Neurological: Negative for seizures and syncope.  All other systems reviewed and are negative.    Physical Exam Triage Vital Signs ED Triage Vitals  Enc Vitals Group     BP      Pulse      Resp      Temp      Temp src      SpO2      Weight  Height      Head Circumference      Peak Flow      Pain Score      Pain Loc      Pain Edu?      Excl. in GC?    No data found.  Updated Vital Signs BP 135/81   Pulse 85   Temp 98.1 F (36.7 C) (Oral)   Resp 16   SpO2 98%   Visual Acuity Right Eye Distance:   Left Eye Distance:   Bilateral Distance:    Right Eye Near:   Left Eye Near:    Bilateral Near:     Physical Exam Vitals and nursing note reviewed.  Constitutional:      General: He is not in acute distress.    Appearance: He is well-developed.  HENT:     Head: Normocephalic and atraumatic.     Mouth/Throat:     Mouth: Mucous membranes are moist.     Dentition: Abnormal dentition. Dental tenderness and dental caries present.   Eyes:     Conjunctiva/sclera: Conjunctivae normal.  Cardiovascular:      Rate and Rhythm: Normal rate and regular rhythm.     Heart sounds: Normal heart sounds.  Pulmonary:     Effort: Pulmonary effort is normal. No respiratory distress.     Breath sounds: Normal breath sounds.  Abdominal:     Palpations: Abdomen is soft.     Tenderness: There is no abdominal tenderness.  Musculoskeletal:     Cervical back: Neck supple.  Skin:    General: Skin is warm and dry.  Neurological:     General: No focal deficit present.     Mental Status: He is alert and oriented to person, place, and time.  Psychiatric:        Mood and Affect: Mood normal.        Behavior: Behavior normal.      UC Treatments / Results  Labs (all labs ordered are listed, but only abnormal results are displayed) Labs Reviewed - No data to display  EKG   Radiology No results found.  Procedures Procedures (including critical care time)  Medications Ordered in UC Medications - No data to display  Initial Impression / Assessment and Plan / UC Course  I have reviewed the triage vital signs and the nursing notes.  Pertinent labs & imaging results that were available during my care of the patient were reviewed by me and considered in my medical decision making (see chart for details).   Pain due to dental caries, dental infection.  Treating with amoxicillin.  Instructed patient to continue ibuprofen at home as needed for discomfort; he reports this has provided good relief of his discomfort.  Dental resource guide provided and instructed patient to schedule an appointment with a dentist as soon as possible.  ED precautions discussed.  Patient agrees to plan of care.   Final Clinical Impressions(s) / UC Diagnoses   Final diagnoses:  Pain due to dental caries  Dental infection     Discharge Instructions     Take the antibiotic as prescribed.    A dental resource guide is attached.  Please call to make an appointment with a dentist as soon as possible.    Go to the  emergency department if you have acute worsening symptoms.        ED Prescriptions    Medication Sig Dispense Auth. Provider   amoxicillin (AMOXIL) 875 MG tablet Take 1 tablet (875  mg total) by mouth 2 (two) times daily for 7 days. 14 tablet Mickie Bail, NP     PDMP not reviewed this encounter.   Mickie Bail, NP 07/13/20 [phone]

## 2020-07-13 NOTE — ED Triage Notes (Signed)
Pt c/o left side facial swelling X 2 days. Pt states he thinks the swelling is coming from his wisdom tooth. Pt states he has called dental offices.

## 2021-06-26 ENCOUNTER — Other Ambulatory Visit: Payer: Self-pay

## 2021-06-26 ENCOUNTER — Encounter (HOSPITAL_COMMUNITY): Payer: Self-pay | Admitting: *Deleted

## 2021-06-26 ENCOUNTER — Ambulatory Visit (HOSPITAL_COMMUNITY)
Admission: EM | Admit: 2021-06-26 | Discharge: 2021-06-26 | Disposition: A | Discharge status: Home / Self Care | Payer: BC Managed Care – PPO | Attending: Emergency Medicine | Admitting: Emergency Medicine

## 2021-06-26 DIAGNOSIS — M25531 Pain in right wrist: Secondary | ICD-10-CM | POA: Diagnosis not present

## 2021-06-26 MED ORDER — CYCLOBENZAPRINE HCL 10 MG PO TABS
10.0000 mg | ORAL_TABLET | Freq: Every day | ORAL | 0 refills | Status: DC
  Filled 2021-06-26: qty 10, 10d supply, fill #0

## 2021-06-26 MED ORDER — MELOXICAM 7.5 MG PO TABS
7.5000 mg | ORAL_TABLET | Freq: Every day | ORAL | 0 refills | Status: DC
  Filled 2021-06-26: qty 30, 30d supply, fill #0

## 2021-06-26 NOTE — ED Triage Notes (Signed)
Pt reports Rt wrist pain that started on Sunday. ?

## 2021-06-26 NOTE — Discharge Instructions (Signed)
Your pain is most likely caused by irritation to the muscles, tendons or ligaments.  ? ?Take meloxicam every morning with food for the next 7 days with the you may use as needed, this medication is to reduce inflammation which in turn should help with your pain ? ?You may use muscle relaxer at bedtime for additional comfort ? ?You may use heating pad in 15 minute intervals as needed for additional comfort, or you may find comfort in using ice in 10-15 minutes over affected area ? ?Begin stretching affected area daily for 10 minutes as tolerated to further loosen muscles  ? ?When lying down place pillow underneath arm ? ?You may wear wrist brace for additional support ? ?If pain persist after recommended treatment or reoccurs if may be beneficial to follow up with orthopedic specialist for evaluation, this doctor specializes in the bones and can manage your symptoms long-term with options such as but not limited to imaging, medications or physical therapy  ?  ?

## 2021-06-26 NOTE — ED Provider Notes (Signed)
?[facility] ? ? ? ?CSN: [id number] ?Arrival date & time: 06/26/21  1412 ? ? ?  ? ?History   ?Chief Complaint ?Chief Complaint  ?Patient presents with  ? Wrist Pain  ?  rt  ? ? ?HPI ?[name] is a 41 y.o. male.  ? ?Patient presents with left wrist pain for 3 days.  Endorses that symptoms worsened 1 day ago.  Initially thought that he slept on his wrist wrong or possibly related to weight lifting but symptoms have persisted.  Limited range of motion, endorses he is unable to fully close his hand.  Has not attempted treatment.  Denies numbness, tingling.  ? ?Past Medical History:  ?Diagnosis Date  ? Gout   ? NGU (nongonococcal urethritis)   ? Skin infection   ? ? ?Patient Active Problem List  ? Diagnosis Date Noted  ? Rheumatoid arthritis(714.0) 09/24/2010  ? Connective tissue disease (HCC) 09/24/2010  ? skin infection, bilateral hands 09/05/2010  ? ? ?History reviewed. No pertinent surgical history. ? ? ? ? ?Home Medications   ? ?Prior to Admission medications   ?Medication Sig Start Date End Date Taking? Authorizing Provider  ?amoxicillin (AMOXIL) 875 MG tablet TAKE 1 TABLET (875 MG TOTAL) BY MOUTH 2 (TWO) TIMES DAILY FOR 7 DAYS. 07/13/20 07/13/21  Mickie Bail, NP  ?cyclobenzaprine (FLEXERIL) 10 MG tablet Take 1 tablet (10 mg total) by mouth 2 (two) times daily as needed for muscle spasms. 07/04/20   Mickie Bail, NP  ?ibuprofen (ADVIL) 800 MG tablet Take 1 tablet (800 mg total) by mouth every 8 (eight) hours as needed. 07/04/20   Mickie Bail, NP  ? ? ?Family History ?Family History  ?Problem Relation Age of Onset  ? Arthritis Mother   ? Cancer Mother   ? Arthritis Father   ? Cancer Father   ? Diabetes Father   ? ? ?Social History ?Social History  ? ?Tobacco Use  ? Smoking status: Never  ? Smokeless tobacco: Never  ?Vaping Use  ? Vaping Use: Never used  ?Substance Use Topics  ? Alcohol use: Yes  ?  Alcohol/week: 2.0 standard drinks  ?  Types: 2 Cans of beer per week  ? Drug use: Yes  ?  Frequency: 3.0  times per week  ?  Types: Marijuana  ? ? ? ?Allergies   ?Other ? ? ?Review of Systems ?Review of Systems  ?Constitutional: Negative.   ?Respiratory: Negative.    ?Musculoskeletal:  Positive for joint swelling. Negative for arthralgias, back pain, gait problem, myalgias, neck pain and neck stiffness.  ?Skin: Negative.   ?Neurological: Negative.   ? ? ?Physical Exam ?Triage Vital Signs ?ED Triage Vitals  ?Enc Vitals Group  ?   BP 06/26/21 1436 (!) 147/87  ?   Pulse Rate 06/26/21 1436 74  ?   Resp 06/26/21 1436 20  ?   Temp 06/26/21 1436 97.7 ?F (36.5 ?C)  ?   Temp src --   ?   SpO2 06/26/21 1436 94 %  ?   Weight --   ?   Height --   ?   Head Circumference --   ?   Peak Flow --   ?   Pain Score 06/26/21 1434 6  ?   Pain Loc --   ?   Pain Edu? --   ?   Excl. in GC? --   ? ?No data found. ? ?Updated Vital Signs ?BP (!) 147/87   Pulse 74  Temp 97.7 ?F (36.5 ?C)   Resp 20   SpO2 94%  ? ?Visual Acuity ?Right Eye Distance:   ?Left Eye Distance:   ?Bilateral Distance:   ? ?Right Eye Near:   ?Left Eye Near:    ?Bilateral Near:    ? ?Physical Exam ?Constitutional:   ?   Appearance: Normal appearance.  ?HENT:  ?   Head: Normocephalic.  ?Eyes:  ?   Extraocular Movements: Extraocular movements intact.  ?Pulmonary:  ?   Effort: Pulmonary effort is normal.  ?Musculoskeletal:  ?   Comments: Tenderness along the ulnar aspect of the left wrist, no swelling, effusion, ecchymosis noted, minimal effort given to range of motion due to pain, 2+ radial pulse, sensation intact, capillary refill less than 3  ?Skin: ?   General: Skin is warm and dry.  ?Neurological:  ?   Mental Status: He is alert and oriented to person, place, and time. Mental status is at baseline.  ?Psychiatric:     ?   Mood and Affect: Mood normal.     ?   Behavior: Behavior normal.  ? ? ? ?UC Treatments / Results  ?Labs ?(all labs ordered are listed, but only abnormal results are displayed) ?Labs Reviewed - No data to display ? ?EKG ? ? ?Radiology ?No results  found. ? ?Procedures ?Procedures (including critical care time) ? ?Medications Ordered in UC ?Medications - No data to display ? ?Initial Impression / Assessment and Plan / UC Course  ?I have reviewed the triage vital signs and the nursing notes. ? ?Pertinent labs & imaging results that were available during my care of the patient were reviewed by me and considered in my medical decision making (see chart for details). ? ?Acute left wrist pain ? ?Etiology of symptoms is mostly irritation to the muscle, tendon or ligament, will defer imaging today due to lack of injury, discussed with patient, offered Toradol injection in office, declined, prescribed meloxicam to be used daily for 7 days then as needed and Flexeril to be used as needed before bed for additional comfort, recommended RICE, wrist brace given and applied in office,, given walker referral to orthopedics if symptoms continue to persist or worsen ?Final Clinical Impressions(s) / UC Diagnoses  ? ?Final diagnoses:  ?None  ? ?Discharge Instructions   ?None ?  ? ?ED Prescriptions   ?None ?  ? ?PDMP not reviewed this encounter. ?  ?Valinda HoarWhite, Shyanne Mcclary R, NP ?06/26/21 1538 ? ?

## 2021-10-21 ENCOUNTER — Emergency Department (HOSPITAL_COMMUNITY)
Admission: EM | Admit: 2021-10-21 | Discharge: 2021-10-22 | Disposition: A | Discharge status: Home / Self Care | Payer: BC Managed Care – PPO | Attending: Emergency Medicine | Admitting: Emergency Medicine

## 2021-10-21 ENCOUNTER — Encounter (HOSPITAL_COMMUNITY): Payer: Self-pay | Admitting: Emergency Medicine

## 2021-10-21 ENCOUNTER — Other Ambulatory Visit: Payer: Self-pay

## 2021-10-21 DIAGNOSIS — Y92411 Interstate highway as the place of occurrence of the external cause: Secondary | ICD-10-CM | POA: Diagnosis not present

## 2021-10-21 DIAGNOSIS — M542 Cervicalgia: Secondary | ICD-10-CM | POA: Diagnosis not present

## 2021-10-21 DIAGNOSIS — R519 Headache, unspecified: Secondary | ICD-10-CM | POA: Diagnosis not present
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
## 2021-10-22 ENCOUNTER — Emergency Department (HOSPITAL_COMMUNITY): Payer: BC Managed Care – PPO
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
## 2021-10-22 NOTE — Discharge Instructions (Signed)
Tylenol or motrin as needed for pain. Can use ice/heat along with this if desired. Follow-up with your primary care doctor. Return here for new concerns.

## 2022-04-07 ENCOUNTER — Ambulatory Visit (HOSPITAL_COMMUNITY)
Admission: EM | Admit: 2022-04-07 | Discharge: 2022-04-07 | Disposition: A | Discharge status: Home / Self Care | Payer: BC Managed Care – PPO

## 2022-04-07 ENCOUNTER — Encounter (HOSPITAL_COMMUNITY): Payer: Self-pay | Admitting: Emergency Medicine

## 2022-04-07 DIAGNOSIS — J069 Acute upper respiratory infection, unspecified: Secondary | ICD-10-CM | POA: Diagnosis not present

## 2022-04-07 NOTE — Discharge Instructions (Signed)

## 2022-04-07 NOTE — ED Provider Notes (Signed)
[place]    CSN: [id number] Arrival date & time: 04/07/22  1051      History   Chief Complaint Chief Complaint  Patient presents with   Cough   Generalized Body Aches    HPI [name] is a 41 y.o. male.   Patient presents for evaluation of fevers, chills, body aches, nasal congestion, rhinorrhea, sore throat, nonproductive cough and intermittent generalized headaches for 3 days.  Sore throat has resolved.  Headache is only experienced with persistent coughing.  No known sick contacts.  Tolerating food and liquids beginning 1 day ago.  Home COVID test negative.  Has attempted use of DayQuil and NyQuil which have been effective.  History of RA.    Past Medical History:  Diagnosis Date   Gout    NGU (nongonococcal urethritis)    Skin infection     Patient Active Problem List   Diagnosis Date Noted   Rheumatoid arthritis(714.0) 09/24/2010   Connective tissue disease ([place]) 09/24/2010   skin infection, bilateral hands 09/05/2010    History reviewed. No pertinent surgical history.     Home Medications    Prior to Admission medications   Medication Sig Start Date End Date Taking? Authorizing Provider  cyclobenzaprine (FLEXERIL) 10 MG tablet Take 1 tablet (10 mg total) by mouth at bedtime. 06/26/21   Teleah Villamar, Leitha Schuller, NP  ibuprofen (ADVIL) 800 MG tablet Take 1 tablet (800 mg total) by mouth every 8 (eight) hours as needed. 07/04/20   Sharion Balloon, NP  meloxicam (MOBIC) 7.5 MG tablet Take 1 tablet (7.5 mg total) by mouth daily. 06/26/21   Hans Eden, NP    Family History Family History  Problem Relation Age of Onset   Arthritis Mother    Cancer Mother    Arthritis Father    Cancer Father    Diabetes Father     Social History Social History   Tobacco Use   Smoking status: Never   Smokeless tobacco: Never  Vaping Use   Vaping Use: Never used  Substance Use Topics   Alcohol use: Yes    Alcohol/week: 2.0 standard drinks of alcohol     Types: 2 Cans of beer per week   Drug use: Yes    Frequency: 3.0 times per week    Types: Marijuana     Allergies   Other   Review of Systems Review of Systems  Constitutional:  Positive for chills and fever. Negative for activity change, appetite change, diaphoresis, fatigue and unexpected weight change.  HENT:  Positive for congestion, rhinorrhea and sore throat. Negative for dental problem, drooling, ear discharge, ear pain, facial swelling, hearing loss, mouth sores, nosebleeds, postnasal drip, sinus pressure, sinus pain, sneezing, tinnitus, trouble swallowing and voice change.   Respiratory:  Positive for cough. Negative for apnea, choking, chest tightness, shortness of breath, wheezing and stridor.   Cardiovascular: Negative.   Gastrointestinal: Negative.   Musculoskeletal:  Positive for myalgias. Negative for arthralgias, back pain, gait problem, joint swelling, neck pain and neck stiffness.  Neurological:  Positive for headaches. Negative for dizziness, tremors, seizures, syncope, facial asymmetry, speech difficulty, weakness, light-headedness and numbness.     Physical Exam Triage Vital Signs ED Triage Vitals  Enc Vitals Group     BP 04/07/22 1216 (!) 143/88     Pulse Rate 04/07/22 1216 74     Resp 04/07/22 1216 16     Temp 04/07/22 1216 98.2 F (36.8 C)     Temp  Source 04/07/22 1216 Oral     SpO2 04/07/22 1216 99 %     Weight --      Height --      Head Circumference --      Peak Flow --      Pain Score 04/07/22 1217 0     Pain Loc --      Pain Edu? --      Excl. in [place]? --    No data found.  Updated Vital Signs BP (!) 143/88 (BP Location: Left Arm)   Pulse 74   Temp 98.2 F (36.8 C) (Oral)   Resp 16   SpO2 99%   Visual Acuity Right Eye Distance:   Left Eye Distance:   Bilateral Distance:    Right Eye Near:   Left Eye Near:    Bilateral Near:     Physical Exam Constitutional:      Appearance: Normal appearance.  HENT:     Head: Normocephalic.      Right Ear: Tympanic membrane, ear canal and external ear normal.     Left Ear: Tympanic membrane, ear canal and external ear normal.     Nose: Congestion and rhinorrhea present.     Mouth/Throat:     Mouth: Mucous membranes are moist.     Pharynx: Posterior oropharyngeal erythema present.  Eyes:     Extraocular Movements: Extraocular movements intact.  Cardiovascular:     Rate and Rhythm: Normal rate and regular rhythm.     Pulses: Normal pulses.     Heart sounds: Normal heart sounds.  Pulmonary:     Effort: Pulmonary effort is normal.     Breath sounds: Normal breath sounds.  Skin:    General: Skin is warm and dry.  Neurological:     Mental Status: He is alert and oriented to person, place, and time. Mental status is at baseline.  Psychiatric:        Mood and Affect: Mood normal.        Behavior: Behavior normal.      UC Treatments / Results  Labs (all labs ordered are listed, but only abnormal results are displayed) Labs Reviewed - No data to display  EKG   Radiology No results found.  Procedures Procedures (including critical care time)  Medications Ordered in UC Medications - No data to display  Initial Impression / Assessment and Plan / UC Course  I have reviewed the triage vital signs and the nursing notes.  Pertinent labs & imaging results that were available during my care of the patient were reviewed by me and considered in my medical decision making (see chart for details).  Viral URI with cough  Patient is in no signs of distress nor toxic appearing.  Vital signs are stable.  Low suspicion for pneumonia, pneumothorax or bronchitis and therefore will defer imaging.May use additional over-the-counter medications as needed for supportive care.  May follow-up with urgent care as needed if symptoms persist or worsen.  Note given.   Final Clinical Impressions(s) / UC Diagnoses   Final diagnoses:  None   Discharge Instructions   None    ED  Prescriptions   None    PDMP not reviewed this encounter.   Valinda Hoar, NP 04/07/22 1228

## 2022-04-07 NOTE — ED Triage Notes (Signed)
Cough, chills, generalized body ache, headache x 3 days. Denies sore throat, N/V/D. Taking dayquil, nyquil for symptom management.

## 2022-05-27 ENCOUNTER — Encounter (HOSPITAL_COMMUNITY): Payer: Self-pay | Admitting: Emergency Medicine

## 2022-05-27 ENCOUNTER — Ambulatory Visit (HOSPITAL_COMMUNITY)
Admission: EM | Admit: 2022-05-27 | Discharge: 2022-05-27 | Disposition: A | Discharge status: Home / Self Care | Payer: BC Managed Care – PPO | Attending: Physician Assistant | Admitting: Physician Assistant

## 2022-05-27 DIAGNOSIS — M069 Rheumatoid arthritis, unspecified: Secondary | ICD-10-CM | POA: Diagnosis not present

## 2022-05-27 DIAGNOSIS — M545 Low back pain, unspecified: Secondary | ICD-10-CM | POA: Insufficient documentation

## 2022-05-27 DIAGNOSIS — J069 Acute upper respiratory infection, unspecified: Secondary | ICD-10-CM | POA: Insufficient documentation

## 2022-05-27 DIAGNOSIS — R058 Other specified cough: Secondary | ICD-10-CM | POA: Diagnosis not present

## 2022-05-27 DIAGNOSIS — F1290 Cannabis use, unspecified, uncomplicated: Secondary | ICD-10-CM | POA: Diagnosis not present

## 2022-05-27 DIAGNOSIS — Z1152 Encounter for screening for COVID-19: Secondary | ICD-10-CM | POA: Insufficient documentation

## 2022-05-27 DIAGNOSIS — M549 Dorsalgia, unspecified: Secondary | ICD-10-CM | POA: Diagnosis present

## 2022-05-27 LAB — POC INFLUENZA A AND B ANTIGEN (URGENT CARE ONLY)
INFLUENZA A ANTIGEN, POC: NEGATIVE
INFLUENZA B ANTIGEN, POC: NEGATIVE

## 2022-05-27 MED ORDER — FLUTICASONE PROPIONATE 50 MCG/ACT NA SUSP: 2.0000 | Freq: Every day | NASAL | 2 refills | Status: DC

## 2022-05-27 MED ORDER — PROMETHAZINE-DM 6.25-15 MG/5ML PO SYRP: 5.0000 mL | ORAL_SOLUTION | Freq: Four times a day (QID) | ORAL | 0 refills | Status: DC | PRN

## 2022-05-27 MED ORDER — CYCLOBENZAPRINE HCL 10 MG PO TABS: 10.0000 mg | ORAL_TABLET | Freq: Three times a day (TID) | ORAL | 0 refills | Status: AC | PRN

## 2022-05-27 NOTE — ED Triage Notes (Signed)
Symptoms started Friday. Nasal congestion, chills, epigastric pain, loss of appetite. Denies N/V/D. Also c/o a rash he developed on limbs and torso after being incarcerated five years ago.

## 2022-05-27 NOTE — ED Provider Notes (Addendum)
[place]    CSN: [id number] Arrival date & time: 05/27/22  1529      History   Chief Complaint Chief Complaint  Patient presents with   Abdominal Pain   Rash    HPI [name] is a 42 y.o. male.   Patient complains of nasal congestion, cough, chills, sweats.  Denies shortness of breath, wheezing.  Symptoms started about 4 days ago.  Denies nausea vomiting or diarrhea.  He reports taking DayQuil with minimal relief.  Reports coworkers are sick with similar symptoms.  Pt also complains of lower back pain without radiation of sx, numbness, tingling.  Reports flexeril has helped in the past, requesting a refill today.     Past Medical History:  Diagnosis Date   Gout    NGU (nongonococcal urethritis)    Skin infection     Patient Active Problem List   Diagnosis Date Noted   Rheumatoid arthritis(714.0) 09/24/2010   Connective tissue disease ([place]) 09/24/2010   skin infection, bilateral hands 09/05/2010    History reviewed. No pertinent surgical history.     Home Medications    Prior to Admission medications   Medication Sig Start Date End Date Taking? Authorizing Provider  fluticasone (FLONASE) 50 MCG/ACT nasal spray Place 2 sprays into both nostrils daily. 05/27/22  Yes Ward, Lenise Arena, PA-C  promethazine-dextromethorphan (PROMETHAZINE-DM) 6.25-15 MG/5ML syrup Take 5 mLs by mouth 4 (four) times daily as needed for cough. 05/27/22  Yes Ward, Lenise Arena, PA-C  cyclobenzaprine (FLEXERIL) 10 MG tablet Take 1 tablet (10 mg total) by mouth at bedtime. 06/26/21   White, Leitha Schuller, NP  ibuprofen (ADVIL) 800 MG tablet Take 1 tablet (800 mg total) by mouth every 8 (eight) hours as needed. 07/04/20   Sharion Balloon, NP  meloxicam (MOBIC) 7.5 MG tablet Take 1 tablet (7.5 mg total) by mouth daily. 06/26/21   Hans Eden, NP    Family History Family History  Problem Relation Age of Onset   Arthritis Mother    Cancer Mother    Arthritis Father    Cancer  Father    Diabetes Father     Social History Social History   Tobacco Use   Smoking status: Never   Smokeless tobacco: Never  Vaping Use   Vaping Use: Never used  Substance Use Topics   Alcohol use: Yes    Alcohol/week: 2.0 standard drinks of alcohol    Types: 2 Cans of beer per week   Drug use: Yes    Frequency: 3.0 times per week    Types: Marijuana     Allergies   Other   Review of Systems Review of Systems  Constitutional:  Positive for chills. Negative for fever.  HENT:  Positive for congestion. Negative for ear pain and sore throat.   Eyes:  Negative for pain and visual disturbance.  Respiratory:  Positive for cough. Negative for shortness of breath.   Cardiovascular:  Negative for chest pain and palpitations.  Gastrointestinal:  Negative for abdominal pain and vomiting.  Genitourinary:  Negative for dysuria and hematuria.  Musculoskeletal:  Negative for arthralgias and back pain.  Skin:  Negative for color change and rash.  Neurological:  Negative for seizures and syncope.  All other systems reviewed and are negative.    Physical Exam Triage Vital Signs ED Triage Vitals  Enc Vitals Group     BP 05/27/22 1753 125/84     Pulse Rate 05/27/22 1753 80  Resp 05/27/22 1753 16     Temp 05/27/22 1753 98.3 F (36.8 C)     Temp Source 05/27/22 1753 Oral     SpO2 05/27/22 1753 95 %     Weight --      Height --      Head Circumference --      Peak Flow --      Pain Score 05/27/22 1756 0     Pain Loc --      Pain Edu? --      Excl. in [place]? --    No data found.  Updated Vital Signs BP 125/84 (BP Location: Left Arm)   Pulse 80   Temp 98.3 F (36.8 C) (Oral)   Resp 16   SpO2 95%   Visual Acuity Right Eye Distance:   Left Eye Distance:   Bilateral Distance:    Right Eye Near:   Left Eye Near:    Bilateral Near:     Physical Exam Vitals and nursing note reviewed.  Constitutional:      General: He is not in acute distress.    Appearance: He is  well-developed.  HENT:     Head: Normocephalic and atraumatic.  Eyes:     Conjunctiva/sclera: Conjunctivae normal.  Cardiovascular:     Rate and Rhythm: Normal rate and regular rhythm.     Heart sounds: No murmur heard. Pulmonary:     Effort: Pulmonary effort is normal. No respiratory distress.     Breath sounds: Normal breath sounds.  Abdominal:     Palpations: Abdomen is soft.     Tenderness: There is no abdominal tenderness.  Musculoskeletal:        General: No swelling.     Cervical back: Neck supple.  Skin:    General: Skin is warm and dry.     Capillary Refill: Capillary refill takes less than 2 seconds.  Neurological:     Mental Status: He is alert.  Psychiatric:        Mood and Affect: Mood normal.      UC Treatments / Results  Labs (all labs ordered are listed, but only abnormal results are displayed) Labs Reviewed  SARS CORONAVIRUS 2 (TAT 6-24 HRS)  POC INFLUENZA A AND B ANTIGEN (URGENT CARE ONLY)    EKG   Radiology No results found.  Procedures Procedures (including critical care time)  Medications Ordered in UC Medications - No data to display  Initial Impression / Assessment and Plan / UC Course  I have reviewed the triage vital signs and the nursing notes.  Pertinent labs & imaging results that were available during my care of the patient were reviewed by me and considered in my medical decision making (see chart for details).     URI.  Patient overall well-appearing in no acute distress.  Vitals within normal limits.  Flu test negative in clinic today.  COVID test pending.  Advised supportive care.  Prescription for Flonase and cough syrup sent to pharmacy.  Work note given.  Return precautions discussed. Final Clinical Impressions(s) / UC Diagnoses   Final diagnoses:  Acute upper respiratory infection     Discharge Instructions      Your flu test is negative today.  Your COVID test is pending, will call with test results if  positive. Treatment is supportive care, treating your symptoms.  Use nasal spray for congestion. Can also use Mucinex.  Can use cough syrup as needed for cough.  Recommend rest, drink plenty of  fluids Return for evaluation if you develop worsening symptoms.       ED Prescriptions     Medication Sig Dispense Auth. Provider   promethazine-dextromethorphan (PROMETHAZINE-DM) 6.25-15 MG/5ML syrup Take 5 mLs by mouth 4 (four) times daily as needed for cough. 118 mL Ward, Janett Billow Z, PA-C   fluticasone (FLONASE) 50 MCG/ACT nasal spray Place 2 sprays into both nostrils daily. 9.9 mL Ward, Lenise Arena, PA-C      PDMP not reviewed this encounter.   Ward, Lenise Arena, PA-C 05/27/22 1842    Ward, Lenise Arena, PA-C 05/27/22 1858

## 2022-05-27 NOTE — Discharge Instructions (Addendum)
Your flu test is negative today.  Your COVID test is pending, will call with test results if positive. Treatment is supportive care, treating your symptoms.  Use nasal spray for congestion. Can also use Mucinex.  Can use cough syrup as needed for cough.  Recommend rest, drink plenty of fluids Return for evaluation if you develop worsening symptoms.

## 2022-05-28 LAB — SARS CORONAVIRUS 2 (TAT 6-24 HRS): SARS Coronavirus 2: NEGATIVE

## 2022-09-03 ENCOUNTER — Emergency Department (HOSPITAL_COMMUNITY): Payer: BC Managed Care – PPO

## 2022-09-03 ENCOUNTER — Emergency Department (HOSPITAL_COMMUNITY)
Admission: EM | Admit: 2022-09-03 | Discharge: 2022-09-03 | Disposition: A | Discharge status: Home / Self Care | Payer: BC Managed Care – PPO | Attending: Emergency Medicine | Admitting: Emergency Medicine

## 2022-09-03 ENCOUNTER — Other Ambulatory Visit: Payer: Self-pay

## 2022-09-03 ENCOUNTER — Encounter (HOSPITAL_COMMUNITY): Payer: Self-pay | Admitting: Emergency Medicine

## 2022-09-03 DIAGNOSIS — M79642 Pain in left hand: Secondary | ICD-10-CM | POA: Diagnosis present

## 2022-09-03 DIAGNOSIS — M10042 Idiopathic gout, left hand: Secondary | ICD-10-CM | POA: Insufficient documentation

## 2022-09-03 DIAGNOSIS — M109 Gout, unspecified: Secondary | ICD-10-CM

## 2022-09-03 MED ORDER — PREDNISONE 10 MG PO TABS: ORAL_TABLET | ORAL | 0 refills | Status: AC

## 2022-09-03 MED ORDER — INDOMETHACIN 50 MG PO CAPS: 50.0000 mg | ORAL_CAPSULE | Freq: Three times a day (TID) | ORAL | 0 refills | Status: DC

## 2022-09-03 MED ORDER — OXYCODONE-ACETAMINOPHEN 5-325 MG PO TABS
2.0000 | ORAL_TABLET | Freq: Once | ORAL | Status: AC
  Administered 2022-09-03: 2 via ORAL
  Filled 2022-09-03: qty 2

## 2022-09-03 NOTE — ED Notes (Signed)
Pt arrives from triage at this time.

## 2022-09-03 NOTE — Discharge Instructions (Addendum)
Your XR did not show any signs of severe infection or other issues related to your concern today. From your exam, I do believe findings are consistent with gout. I am starting you on 2 medications to help with this. Sometimes this can be triggered by diet so I provided information on how to adjust diet if possible to help with recurrence of these symptoms.  As you have had these symptoms multiple times before, I do believe you would benefit from seeing rheumatology. I provided our rheumatologist you can schedule an appointment with. I also provided primary care follow up if you do not currently have a PCP.   For any new or worsening symptoms including injury to the hand, fever, body aches, chest pain, shortness of breath, or other new, concerning symptoms, please return to nearest ED for re-evaluation.

## 2022-09-03 NOTE — ED Triage Notes (Signed)
Pt with pain to L index knuckle. States he has had problems with this area in the past, has seen a hand specialist but has no answers. Limited ROM to finger, denies any recent injury to finger

## 2022-09-03 NOTE — ED Provider Notes (Addendum)
[place] EMERGENCY DEPARTMENT AT [facility] Provider Note   CSN: [id number] Arrival date & time: 09/03/22  9147     History  Chief Complaint  Patient presents with   Finger Pain    [name] is a 42 y.o. male who presents to ED c/o left second finger pain at PIP joint for the last week. Notes associated warmth to finger. No fever, other joint pain, chest pain, shortness of breath, injury to hand, or other complaints. History of this many times before with varying diagnoses including mixed connective tissue disease, gout, Raynaud's, etc. No alcohol use.       Home Medications Prior to Admission medications   Medication Sig Start Date End Date Taking? Authorizing Provider  indomethacin (INDOCIN) 50 MG capsule Take 1 capsule (50 mg total) by mouth 3 (three) times daily for 3 days. 09/03/22 09/06/22 Yes Keyshaun Exley L, PA-C  predniSONE (DELTASONE) 10 MG tablet Take 5 tablets (50 mg total) by mouth daily with breakfast for 2 days, THEN 4 tablets (40 mg total) daily with breakfast for 2 days, THEN 3 tablets (30 mg total) daily with breakfast for 2 days, THEN 2 tablets (20 mg total) daily with breakfast for 2 days, THEN 1 tablet (10 mg total) daily with breakfast for 2 days. Take 5 tabs for 2 days, then 4 tabs for 2 days, then 3 tabs for 2 days, 2 tabs for 2 days, then 1 tab by mouth daily for 2 days. 09/03/22 09/13/22 Yes Gurtha Picker L, PA-C  fluticasone (FLONASE) 50 MCG/ACT nasal spray Place 2 sprays into both nostrils daily. 05/27/22   Ward, Tylene Fantasia, PA-C      Allergies    Other    Review of Systems   Review of Systems  All other systems reviewed and are negative.   Physical Exam Updated Vital Signs BP 130/85 (BP Location: Right Arm)   Pulse 63   Temp 98.2 F (36.8 C)   Resp 17   Wt 113.4 kg   SpO2 98%   BMI 31.25 kg/m  Physical Exam Vitals and nursing note reviewed.  Constitutional:      General: He is not in acute distress.    Appearance: Normal  appearance.  HENT:     Head: Normocephalic and atraumatic.     Mouth/Throat:     Mouth: Mucous membranes are moist.  Eyes:     Conjunctiva/sclera: Conjunctivae normal.  Cardiovascular:     Rate and Rhythm: Normal rate and regular rhythm.     Heart sounds: No murmur heard. Pulmonary:     Effort: Pulmonary effort is normal.     Breath sounds: Normal breath sounds.  Abdominal:     General: Abdomen is flat.     Palpations: Abdomen is soft.  Musculoskeletal:     Cervical back: Neck supple.     Comments: Left second finger with increased warmth, tenderness, and swelling from DIP joint distally worse from PIP joint distally, no ulcerations, no drainage, no evidence of trauma, no deformity, range of motion at DIP joint limited secondary to swelling, no fluctuance, induration, no nail involvement, remainder of hand/wrist/forearm non-tender with soft compartments, 2+ radial pulses, sensation intact, no cyanosis, skin normal for ethnicity  Skin:    General: Skin is warm and dry.     Capillary Refill: Capillary refill takes less than 2 seconds.  Neurological:     Mental Status: He is alert. Mental status is at baseline.  Psychiatric:  Behavior: Behavior normal.     ED Results / Procedures / Treatments   Labs (all labs ordered are listed, but only abnormal results are displayed) Labs Reviewed - No data to display  EKG None  Radiology DG Hand 2 View Left  Result Date: 09/03/2022 CLINICAL DATA:  Left index finger pain. EXAM: LEFT HAND - 2 VIEW COMPARISON:  None Available. FINDINGS: There is no evidence of an acute fracture or dislocation. An ill-defined chronic appearing deformity is seen involving the tuft of the distal phalanx of the third left finger. There is no evidence of arthropathy. Soft tissues are unremarkable. IMPRESSION: Chronic appearing deformity involving the tuft of the distal phalanx of the third left finger. Electronically Signed   By: Aram Candela M.D.   On:  09/03/2022 07:03    Procedures Procedures    Medications Ordered in ED Medications  oxyCODONE-acetaminophen (PERCOCET/ROXICET) 5-325 MG per tablet 2 tablet (2 tablets Oral Given 09/03/22 0720)    ED Course/ Medical Decision Making/ A&P                             Medical Decision Making Amount and/or Complexity of Data Reviewed Radiology: ordered. Decision-making details documented in ED Course.  Risk Prescription drug management.   Medical Decision Making:   [name] is a 42 y.o. male who presented to the ED today with finger pain detailed above.     Complete initial physical exam performed, notably the patient was with left second finger with increased warmth, tenderness, and swelling from DIP joint distally worse from PIP joint distally, no ulcerations, no drainage, no evidence of trauma, no deformity, range of motion at DIP joint limited secondary to swelling, remainder of hand/wrist/forearm non-tender with soft compartments, 2+ radial pulses, sensation intact, no cyanosis, skin normal for ethnicity.     Reviewed and confirmed nursing documentation for past medical history, family history, social history.    Initial Assessment:   With the patient's presentation, differential diagnosis  includes but is not limited to gout, osteoarthritis, rheumatoid arthritis, septic joint, osteomyelitis, cellulitis, compartment syndrome, fracture, dislocation, Raynaud's.  This is most consistent with an acute complicated illness  Initial Plan:  XR to assess for bony pathology Symptomatic treatment Objective evaluation as below reviewed   Initial Study Results:   Radiology:  All images reviewed independently. Agree with radiology report at this time.   DG Hand 2 View Left  Result Date: 09/03/2022 CLINICAL DATA:  Left index finger pain. EXAM: LEFT HAND - 2 VIEW COMPARISON:  None Available. FINDINGS: There is no evidence of an acute fracture or dislocation. An ill-defined chronic  appearing deformity is seen involving the tuft of the distal phalanx of the third left finger. There is no evidence of arthropathy. Soft tissues are unremarkable. IMPRESSION: Chronic appearing deformity involving the tuft of the distal phalanx of the third left finger. Electronically Signed   By: Aram Candela M.D.   On: 09/03/2022 07:03      Final Assessment and Plan:   42 year old male present to the ED complaining of left second finger pain.  History of similar with multiple previous diagnoses.  Digit is swollen, tender, and with increased warmth from the PIP joint distally worse at the DIP joint.  Range of motion limited secondary to swelling.  No other joint involvement.  No signs of wounds to the hand.  Neurovascularly intact.  Patient afebrile.  Nontoxic-appearing.  Low suspicion for septic joint.  X-ray does not show acute findings. No coolness, skin color changes, low suspicion for Raynaud's. No trauma so unlikely acute bony pathology.  Findings seem most consistent with gouty arthritis.  Discussed this with patient and will prescribe indomethacin and prednisone for home to help with symptoms.  As he has a history of recurrent symptoms, will also provide rheumatology follow-up. Pt expressed understanding of plan. Strict ED return precautions given, all questions answered, and stable for discharge.    Clinical Impression:  1. Acute gout of left hand, unspecified cause      Discharge           Final Clinical Impression(s) / ED Diagnoses Final diagnoses:  Acute gout of left hand, unspecified cause    Rx / DC Orders ED Discharge Orders          Ordered    indomethacin (INDOCIN) 50 MG capsule  3 times daily        09/03/22 0649    predniSONE (DELTASONE) 10 MG tablet  Q breakfast        09/03/22 0649              Tonette Lederer, PA-C 09/03/22 0804    Tonette Lederer, PA-C 09/03/22 0850    Gloris Manchester, MD 09/08/22 0830

## 2022-09-06 ENCOUNTER — Encounter (HOSPITAL_COMMUNITY): Payer: Self-pay

## 2022-09-06 ENCOUNTER — Emergency Department (HOSPITAL_COMMUNITY)
Admission: EM | Admit: 2022-09-06 | Discharge: 2022-09-06 | Disposition: A | Discharge status: Home / Self Care | Payer: BC Managed Care – PPO | Attending: Emergency Medicine | Admitting: Emergency Medicine

## 2022-09-06 DIAGNOSIS — M109 Gout, unspecified: Secondary | ICD-10-CM

## 2022-09-06 DIAGNOSIS — M10042 Idiopathic gout, left hand: Secondary | ICD-10-CM | POA: Diagnosis present

## 2022-09-06 DIAGNOSIS — Z760 Encounter for issue of repeat prescription: Secondary | ICD-10-CM | POA: Insufficient documentation

## 2022-09-06 MED ORDER — INDOMETHACIN 50 MG PO CAPS: 50.0000 mg | ORAL_CAPSULE | Freq: Three times a day (TID) | ORAL | 0 refills | Status: AC

## 2022-09-06 NOTE — ED Triage Notes (Signed)
Pt c/o gout. Pt states that he was given an rx but did not help. Pt states he is out of his indomethacin 50mg  and nearly out of prednison 10mg .

## 2022-09-06 NOTE — ED Provider Notes (Signed)
[place] EMERGENCY DEPARTMENT AT [facility] Provider Note   CSN: [id number] Arrival date & time: 09/06/22  1614     History Chief Complaint  Patient presents with   Gout   Medication Refill    [name] is a 42 y.o. male.  Patient presents emergency department complaints of gout.  Patient was seen for very similar complaints 2 days ago but reports he has not run out of medications as prescribed.  He is still taking prednisone with the indomethacin.  Reports some improvement with these medications.  Patient reports has been evaluated extensively for this condition and feels that he has not had any acute improvement over several years.  Denies any fevers, significant joint swelling, drainage.   Medication Refill      Home Medications Prior to Admission medications   Medication Sig Start Date End Date Taking? Authorizing Provider  fluticasone (FLONASE) 50 MCG/ACT nasal spray Place 2 sprays into both nostrils daily. 05/27/22   Ward, Tylene Fantasia, PA-C  indomethacin (INDOCIN) 50 MG capsule Take 1 capsule (50 mg total) by mouth 3 (three) times daily for 5 days. 09/06/22 09/11/22  Smitty Knudsen, PA-C  predniSONE (DELTASONE) 10 MG tablet Take 5 tablets (50 mg total) by mouth daily with breakfast for 2 days, THEN 4 tablets (40 mg total) daily with breakfast for 2 days, THEN 3 tablets (30 mg total) daily with breakfast for 2 days, THEN 2 tablets (20 mg total) daily with breakfast for 2 days, THEN 1 tablet (10 mg total) daily with breakfast for 2 days. Take 5 tabs for 2 days, then 4 tabs for 2 days, then 3 tabs for 2 days, 2 tabs for 2 days, then 1 tab by mouth daily for 2 days. 09/03/22 09/13/22  Gowens, Lawrence Marseilles, PA-C      Allergies    Other    Review of Systems   Review of Systems  Musculoskeletal:  Positive for joint swelling.  All other systems reviewed and are negative.   Physical Exam Updated Vital Signs BP 127/75 (BP Location: Right Arm)   Pulse (!) 58   Temp 98.2  F (36.8 C) (Oral)   Resp 17   Ht 6\' 3"  (1.905 m)   Wt 113.4 kg   SpO2 97%   BMI 31.25 kg/m  Physical Exam Vitals and nursing note reviewed.  HENT:     Head: Normocephalic and atraumatic.  Eyes:     General: No scleral icterus.       Right eye: No discharge.        Left eye: No discharge.  Cardiovascular:     Rate and Rhythm: Normal rate and regular rhythm.  Musculoskeletal:        General: Swelling and tenderness present. No deformity or signs of injury.     Comments: Some swelling and pain noted to the left pointer finger.  No significant redness or warmth at the site.  Skin:    Findings: No rash.     ED Results / Procedures / Treatments   Labs (all labs ordered are listed, but only abnormal results are displayed) Labs Reviewed - No data to display  EKG None  Radiology No results found.  Procedures Procedures   Medications Ordered in ED Medications - No data to display  ED Course/ Medical Decision Making/ A&P                           Medical  Decision Making  This patient presents to the ED for concern of gout, medication refill.  Differential diagnosis includes sepsis, diverticulitis, acute gout, joint effusion   Problem List / ED Course:  Presents emergency department complaints of gout and requesting medication refill.  He reports that he was given prednisone and indomethacin a few days ago and he was seen for similar concerns.  Reports that he had some improvement at the indomethacin and prednisone.  He is now currently out of indomethacin and is requesting a refill on this medication.  Patient has symptomatic improvement with this medication, will represcribe medication for course of 5 days.  Advised patient that afterwards, he may take ibuprofen or Aleve for gout symptoms.  Also provided patient with information/handout on low purine diet for gout management.  Patient is agreeable with this treatment plan verbalized understanding all return  precautions. Patient's affected joint is not erythematous or warm to the touch and patient does still have preserved range of motion in it although somewhat difficult due to pain.  Minimal concern at this time for septic joint.  Final Clinical Impression(s) / ED Diagnoses Final diagnoses:  Acute gout of left hand, unspecified cause    Rx / DC Orders ED Discharge Orders          Ordered    indomethacin (INDOCIN) 50 MG capsule  3 times daily        09/06/22 1705              Smitty Knudsen, PA-C 09/06/22 1712    Pricilla Loveless, MD 09/06/22 1712

## 2022-09-06 NOTE — Discharge Instructions (Addendum)
You are seen in the emergency department for gout and medication refill.  I sent a refill of your indomethacin to your pharmacy.  Please take this medication as prescribed.  I would highly encourage you to follow-up with her primary care provider for further evaluation of your symptoms.  If you have any acute worsening of your symptoms please return to the emergency department for further evaluation.

## 2022-09-18 ENCOUNTER — Inpatient Hospital Stay (INDEPENDENT_AMBULATORY_CARE_PROVIDER_SITE_OTHER): Payer: BC Managed Care – PPO | Admitting: Primary Care

## 2022-10-28 ENCOUNTER — Emergency Department (HOSPITAL_COMMUNITY)
Admission: EM | Admit: 2022-10-28 | Discharge: 2022-10-29 | Disposition: A | Discharge status: Home / Self Care | Payer: BC Managed Care – PPO | Attending: Emergency Medicine | Admitting: Emergency Medicine

## 2022-10-28 ENCOUNTER — Emergency Department (HOSPITAL_COMMUNITY): Payer: BC Managed Care – PPO

## 2022-10-28 ENCOUNTER — Other Ambulatory Visit: Payer: Self-pay

## 2022-10-28 ENCOUNTER — Encounter (HOSPITAL_COMMUNITY): Payer: Self-pay

## 2022-10-28 DIAGNOSIS — M152 Bouchard's nodes (with arthropathy): Secondary | ICD-10-CM

## 2022-10-28 DIAGNOSIS — B360 Pityriasis versicolor: Secondary | ICD-10-CM | POA: Diagnosis not present

## 2022-10-28 DIAGNOSIS — M7989 Other specified soft tissue disorders: Secondary | ICD-10-CM | POA: Diagnosis present

## 2022-10-28 DIAGNOSIS — M19042 Primary osteoarthritis, left hand: Secondary | ICD-10-CM | POA: Diagnosis not present

## 2022-10-28 HISTORY — DX: Unspecified osteoarthritis, unspecified site: M19.90

## 2022-10-28 NOTE — ED Triage Notes (Signed)
Pt arrived via POV c/o left pointer finger swelling and pain 8/10 pain scale. Pt has hx of gout. 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 Zero hives noted in triage.

## 2022-10-28 NOTE — ED Provider Triage Note (Signed)
Emergency Medicine Provider Triage Evaluation Note  [name] , a 42 y.o. male  was evaluated in triage.  Pt complains of left index finger pain.  Patient has known history of gout of the finger.  He reports that he had a specialist appointment however missed it due to he was out of town.  Reports he is out of his medication and his pain came back.  No fevers.  Additionally, he mentions he has been having a rash to his bilateral arms and chest after doing some tree work yesterday.  Dry skin noted, itching, not painful.  No fevers..  Review of Systems  Positive:  Negative:   Physical Exam  BP (!) 140/84 (BP Location: Right Arm)   Pulse 81   Temp 97.7 F (36.5 C) (Oral)   Resp 14   SpO2 97%  Gen:   Awake, no distress   Resp:  Normal effort  MSK:   Moves extremities without difficulty  Other:  Only noted to the PIP joint of the left index finger.  Patient able to flex and extend however there are some swelling present.  Cap refill brisk.  Palpable radial pulses.  No overlying warmth.  Medical Decision Making  Medically screening exam initiated at 9:18 PM.  Appropriate orders placed.  [name] was informed that the remainder of the evaluation will be completed by another provider, this initial triage assessment does not replace that evaluation, and the importance of remaining in the ED until their evaluation is complete.  X-ray ordered.  Likely contact dermatitis of the chest and arms.  No recent medications.  No blistering or skin sloughing noted.   Achille Rich, [place] 10/28/22 2119

## 2022-10-29 MED ORDER — MELOXICAM 7.5 MG PO TABS: 7.5000 mg | ORAL_TABLET | Freq: Every day | ORAL | 0 refills | Status: AC

## 2022-10-29 MED ORDER — KETOCONAZOLE 2 % EX CREA: 1.0000 | TOPICAL_CREAM | Freq: Every day | CUTANEOUS | 0 refills | Status: DC

## 2022-10-29 NOTE — Discharge Instructions (Addendum)
Take Meloxicam for your finger pain and swelling. Follow up with a hand specialist.   Use Ketoconazole as prescribed to areas where rash is present. Also use Selsun blue shampoo on areas where rash is present when you are bathing.

## 2022-10-29 NOTE — ED Provider Notes (Signed)
[place] EMERGENCY DEPARTMENT AT [facility] Provider Note   CSN: [id number] Arrival date & time: 10/28/22  2045     History  Chief Complaint  Patient presents with   Finger Injury    [name] is a 42 y.o. male.  42 y/o male presents for ongoing pain and swelling to the PIP of the left index finger. Seen for same in May and was started on indomethacin and prednisone. Reports minimal resolution of symptoms with these medications. Returning because the pain continues. Has has difficulty moving the digit 2/2 inflammation at the PIP joint. No fevers. As an aside, patient noted a rash to bilateral forearms and upper arms as well as chest. Noticed the rash after doing to some tree work yesterday. No skin sloughing, drainage or weeping of the skin. No new soaps, lotions, detergents.      Home Medications Prior to Admission medications   Medication Sig Start Date End Date Taking? Authorizing Provider  ketoconazole (NIZORAL) 2 % cream Apply 1 Application topically daily. Apply to rash. Do not use on face. 10/29/22  Yes Antony Madura, PA-C  meloxicam (MOBIC) 7.5 MG tablet Take 1 tablet (7.5 mg total) by mouth daily for 15 days. 10/29/22 11/13/22 Yes Antony Madura, PA-C  fluticasone (FLONASE) 50 MCG/ACT nasal spray Place 2 sprays into both nostrils daily. Patient not taking: Reported on 10/28/2022 05/27/22   Ward, Tylene Fantasia, PA-C      Allergies    Other    Review of Systems   Review of Systems Ten systems reviewed and are negative for acute change, except as noted in the HPI.    Physical Exam Updated Vital Signs BP 126/87   Pulse 73   Temp 98.4 F (36.9 C)   Resp 18   SpO2 98%   Physical Exam Vitals and nursing note reviewed.  Constitutional:      General: He is not in acute distress.    Appearance: He is well-developed. He is not diaphoretic.  HENT:     Head: Normocephalic and atraumatic.  Eyes:     General: No scleral icterus.    Conjunctiva/sclera:  Conjunctivae normal.  Pulmonary:     Effort: Pulmonary effort is normal. No respiratory distress.  Musculoskeletal:     Cervical back: Normal range of motion.     Comments: Swelling of the PIP joint of the L index finger. AROM limited by swelling, preserved PROM. No erythema, heat to touch, drainage. Some faint cracking noted overlying the 2nd-4th PIP joints.  Skin:    General: Skin is warm and dry.     Coloration: Skin is not pale.     Findings: Rash present. No erythema.     Comments: Planar maculopapular rash to BUE with some faint pigmentation change. No papules, pustules, induration, blanching erythema, or skin sloughing.  Neurological:     Mental Status: He is alert and oriented to person, place, and time.     Coordination: Coordination normal.  Psychiatric:        Behavior: Behavior normal.     ED Results / Procedures / Treatments   Labs (all labs ordered are listed, but only abnormal results are displayed) Labs Reviewed - No data to display  EKG None  Radiology DG Hand Complete Left  Result Date: 10/28/2022 CLINICAL DATA:  Index finger injury with pain. EXAM: LEFT HAND - COMPLETE 3+ VIEW COMPARISON:  09/03/2022. FINDINGS: There is no evidence of acute fracture or dislocation. A tiny bony density is seen  at the tip of the tuft of the distal phalanx of the second digit, unchanged from the previous exam. There is chronic deformity of the tuft of the distal phalanx of the third digit. Soft tissues are unremarkable. IMPRESSION: No acute fracture or dislocation. Electronically Signed   By: Thornell Sartorius M.D.   On: 10/28/2022 21:32    Procedures Procedures    Medications Ordered in ED Medications - No data to display  ED Course/ Medical Decision Making/ A&P                             Medical Decision Making Risk Prescription drug management.   This patient presents to the ED for concern of pain to L 2nd finger, this involves an extensive number of treatment options,  and is a complaint that carries with it a high risk of complications and morbidity.  The differential diagnosis includes tenosynovitis vs osteoarthritis vs abscess/cellulitis vs fracture vs dislocation   Co morbidities that complicate the patient evaluation  Gout Arthritis    Additional history obtained:  External records from outside source obtained and reviewed including Xray L hand from 09/03/22 for imaging comparison   Imaging Studies ordered:  I ordered imaging studies including Xray L hand  I independently visualized and interpreted imaging which showed no acute fracture or bony deformity I agree with the radiologist interpretation   Cardiac Monitoring:  The patient was maintained on a cardiac monitor.  I personally viewed and interpreted the cardiac monitored which showed an underlying rhythm of: NSR   Medicines ordered and prescription drug management:  I have reviewed the patients home medicines and have made adjustments as needed   Test Considered:  CBC   Problem List / ED Course:  Patient presents to the emergency department for evaluation of pain to PIP of L index finger. Patient neurovascularly intact on exam. Imaging negative for fracture, dislocation, bony deformity. No erythema, heat to touch to the affected area; no concern for septic joint.  The patient was previously believed to have had gout in this finger.  Was placed on prednisone as well as indomethacin.  He reports little change with this.  My exam findings are more suspicious for chronic osteoarthritic change.  There is some swelling to the joint, but no drainage, fluctuance.  No concern for abscess.  Considered placing the patient back on a course of prednisone, but his rash, unfortunately, appears consistent with tinea versicolor which may worsen with steroids. With regards to management of his rash, have given a prescription for ketoconazole cream and advised using Selsun Blue when bathing. Stable  for follow-up with outpatient primary care doctor.   Reevaluation:  After the interventions noted above, I reevaluated the patient and found that they have :stayed the same   Social Determinants of Health:  Lives independently    Dispostion:  After consideration of the diagnostic results and the patients response to treatment, I feel that the patent would benefit from supportive care and PCP follow up. Stable for discharge. Return precautions discussed and provided. Patient discharged in stable condition with no unaddressed concerns.          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

## 2022-11-28 ENCOUNTER — Ambulatory Visit (HOSPITAL_COMMUNITY)
Admission: EM | Admit: 2022-11-28 | Discharge: 2022-11-28 | Disposition: A | Discharge status: Home / Self Care | Payer: BC Managed Care – PPO | Attending: Family Medicine | Admitting: Family Medicine

## 2022-11-28 ENCOUNTER — Encounter (HOSPITAL_COMMUNITY): Payer: Self-pay | Admitting: Emergency Medicine

## 2022-11-28 DIAGNOSIS — T63441A Toxic effect of venom of bees, accidental (unintentional), initial encounter: Secondary | ICD-10-CM | POA: Diagnosis not present

## 2022-11-28 DIAGNOSIS — T7840XA Allergy, unspecified, initial encounter: Secondary | ICD-10-CM

## 2022-11-28 MED ORDER — METHYLPREDNISOLONE SODIUM SUCC 125 MG IJ SOLR
125.0000 mg | Freq: Once | INTRAMUSCULAR | Status: AC
  Administered 2022-11-28: 125 mg via INTRAMUSCULAR

## 2022-11-28 MED ORDER — METHYLPREDNISOLONE SODIUM SUCC 125 MG IJ SOLR
INTRAMUSCULAR | Status: AC
  Filled 2022-11-28: qty 2

## 2022-11-28 MED ORDER — PREDNISONE 20 MG PO TABS: 40.0000 mg | ORAL_TABLET | Freq: Every day | ORAL | 0 refills | Status: DC

## 2022-11-28 NOTE — Discharge Instructions (Signed)
You may use over the counter Benadryl as needed. This will also help with an allergic reaction.Marland Kitchen

## 2022-11-28 NOTE — ED Triage Notes (Signed)
Pt reports stung yesterday 3 times by bee. Reports swelling in right side of face that hasn't gone done. Hasn't taken any medications or regimens to try to help.

## 2022-11-28 NOTE — ED Provider Notes (Signed)
[facility]   [id number] 11/28/22 Arrival Time: 1249  ASSESSMENT & PLAN:  1. Allergic reaction, initial encounter   2. Bee sting, accidental or unintentional, initial encounter    Without resp/swallowing difficulties.  Meds ordered this encounter  Medications   methylPREDNISolone sodium succinate (SOLU-MEDROL) 125 mg/2 mL injection 125 mg   predniSONE (DELTASONE) 20 MG tablet    Sig: Take 2 tablets (40 mg total) by mouth daily.    Dispense:  10 tablet    Refill:  0     Discharge Instructions      You may use over the counter Benadryl as needed. This will also help with an allergic reaction..      Reviewed expectations re: course of current medical issues. Questions answered. Outlined signs and symptoms indicating need for more acute intervention. Patient verbalized understanding. After Visit Summary given.   SUBJECTIVE: History from: patient. XZAVIOUS BRINKWORTH is a 42 y.o. male who reports being stung yesterday 3 times by bee. Reports swelling in right side of face that hasn't gone done. Hasn't taken any medications or regimens to try to help. Denies trouble breathing/swallowing.  OBJECTIVE:  Vitals:   11/28/22 1433  BP: 125/78  Pulse: 67  Resp: 17  Temp: (!) 97.4 F (36.3 C)  TempSrc: Oral  SpO2: 97%    General appearance: alert; no distress Eyes: PERRLA; EOMI; conjunctiva normal HENT: normocephalic; atraumatic; TMs normal; nasal mucosa normal; oral mucosa normal Neck: supple  Lungs: clear to auscultation bilaterally Heart: regular rate and rhythm Extremities: no cyanosis or edema; symmetrical with no gross deformities Skin: warm and dry; vague area of swelling over R temporal region; cool to touch; no FB identified Neurologic: normal gait; normal symmetric reflexes Psychological: alert and cooperative; normal mood and affect   Allergies  Allergen Reactions   Other     PORK causes N/V    Past Medical History:  Diagnosis Date    Arthritis    Gout    NGU (nongonococcal urethritis)    Skin infection    Social History   Socioeconomic History   Marital status: Significant Other    Spouse name: Not on file   Number of children: Not on file   Years of education: Not on file   Highest education level: Not on file  Occupational History   Not on file  Tobacco Use   Smoking status: Never   Smokeless tobacco: Never  Vaping Use   Vaping status: Never Used  Substance and Sexual Activity   Alcohol use: Yes    Alcohol/week: 2.0 standard drinks of alcohol    Types: 2 Cans of beer per week   Drug use: Yes    Frequency: 3.0 times per week    Types: Marijuana   Sexual activity: Yes    Birth control/protection: Condom  Other Topics Concern   Not on file  Social History Narrative   Not on file   Social Determinants of Health   Financial Resource Strain: Not on file  Food Insecurity: Not on file  Transportation Needs: Not on file  Physical Activity: Not on file  Stress: Not on file  Social Connections: Not on file  Intimate Partner Violence: Not on file   Family History  Problem Relation Age of Onset   Arthritis Mother    Cancer Mother    Arthritis Father    Cancer Father    Diabetes Father    History reviewed. No pertinent surgical history.   Mardella Layman, MD  11/28/22 1546  

## 2023-01-04 ENCOUNTER — Ambulatory Visit (HOSPITAL_COMMUNITY)
Admission: EM | Admit: 2023-01-04 | Discharge: 2023-01-04 | Disposition: A | Discharge status: Home / Self Care | Payer: BC Managed Care – PPO | Attending: Internal Medicine | Admitting: Internal Medicine

## 2023-01-04 ENCOUNTER — Encounter (HOSPITAL_COMMUNITY): Payer: Self-pay

## 2023-01-04 DIAGNOSIS — T63441A Toxic effect of venom of bees, accidental (unintentional), initial encounter: Secondary | ICD-10-CM

## 2023-01-04 MED ORDER — METHYLPREDNISOLONE SODIUM SUCC 125 MG IJ SOLR
80.0000 mg | Freq: Once | INTRAMUSCULAR | Status: AC
  Administered 2023-01-04: 80 mg via INTRAMUSCULAR

## 2023-01-04 MED ORDER — METHYLPREDNISOLONE SODIUM SUCC 125 MG IJ SOLR
INTRAMUSCULAR | Status: AC
  Filled 2023-01-04: qty 2

## 2023-01-04 MED ORDER — PREDNISONE 20 MG PO TABS: 40.0000 mg | ORAL_TABLET | Freq: Every day | ORAL | 0 refills | Status: DC

## 2023-01-04 NOTE — ED Triage Notes (Signed)
Stung by several bees this afternoon.Pt took 2 benadryl around 5:00. Pt reports itching all over.

## 2023-01-04 NOTE — Discharge Instructions (Signed)
You were given an injection of steroids today.  Start prednisone 2 tablets daily tomorrow for 5 days.  May use Benadryl to help with itching.  Return to clinic if symptoms worsen or fail to improve.  Consider following up with a primary care physician to discuss the allergy you have to bee stings.

## 2023-01-04 NOTE — ED Provider Notes (Addendum)
[facility]    CSN: [id number] Arrival date & time: 01/04/23  1725      History   Chief Complaint No chief complaint on file.   HPI [name] is a 42 y.o. male.   42 year old male who presents to urgent care with complaints of allover hives, itching and rash after being stung by several bees about an hour ago.  This has happened previously where he had facial swelling associated with it.  He denies any shortness of breath, facial swelling, difficulty breathing or other associated symptoms.  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  Today he got stung on the right ear twice on the back of the head.  He does not carry an EpiPen and has never had a true anaphylactic reaction.     Past Medical History:  Diagnosis Date   Arthritis    Gout    NGU (nongonococcal urethritis)    Skin infection     Patient Active Problem List   Diagnosis Date Noted   Rheumatoid arthritis(714.0) 09/24/2010   Connective tissue disease (HCC) 09/24/2010   skin infection, bilateral hands 09/05/2010    History reviewed. No pertinent surgical history.     Home Medications    Prior to Admission medications   Medication Sig Start Date End Date Taking? Authorizing Provider  fluticasone (FLONASE) 50 MCG/ACT nasal spray Place 2 sprays into both nostrils daily. Patient not taking: Reported on 10/28/2022 05/27/22   Ward, Tylene Fantasia, PA-C  predniSONE (DELTASONE) 20 MG tablet Take 2 tablets (40 mg total) by mouth daily. 11/28/22   Mardella Layman, MD    Family History Family History  Problem Relation Age of Onset   Arthritis Mother    Cancer Mother    Arthritis Father    Cancer Father    Diabetes Father     Social History Social History   Tobacco Use   Smoking status: Never   Smokeless tobacco: Never  Vaping Use   Vaping status: Never Used  Substance Use Topics   Alcohol use: Yes    Alcohol/week: 2.0 standard drinks of alcohol    Types: 2 Cans of beer per  week   Drug use: Yes    Frequency: 3.0 times per week    Types: Marijuana     Allergies   Other   Review of Systems Review of Systems  Constitutional:  Negative for chills and fever.  HENT:  Negative for ear pain and sore throat.   Eyes:  Negative for pain and visual disturbance.  Respiratory:  Negative for cough and shortness of breath.   Cardiovascular:  Negative for chest pain and palpitations.  Gastrointestinal:  Negative for abdominal pain and vomiting.  Genitourinary:  Negative for dysuria and hematuria.  Musculoskeletal:  Negative for arthralgias and back pain.  Skin:  Positive for rash. Negative for color change.  Neurological:  Negative for seizures and syncope.  All other systems reviewed and are negative.    Physical Exam Triage Vital Signs ED Triage Vitals  Encounter Vitals Group     BP 01/04/23 1730 (!) 128/94     Systolic BP Percentile --      Diastolic BP Percentile --      Pulse Rate 01/04/23 1728 (!) 102     Resp 01/04/23 1728 18     Temp 01/04/23 1728 98.6 F (37 C)     Temp Source 01/04/23 1728 Oral     SpO2 01/04/23  1728 99 %     Weight --      Height --      Head Circumference --      Peak Flow --      Pain Score --      Pain Loc --      Pain Education --      Exclude from Growth Chart --    No data found.  Updated Vital Signs BP (!) 128/94 (BP Location: Left Arm)   Pulse (!) 102   Temp 98.6 F (37 C) (Oral)   Resp 18   SpO2 97%   Visual Acuity Right Eye Distance:   Left Eye Distance:   Bilateral Distance:    Right Eye Near:   Left Eye Near:    Bilateral Near:     Physical Exam Vitals and nursing note reviewed.  Constitutional:      General: He is not in acute distress.    Appearance: He is well-developed.  HENT:     Head: Normocephalic and atraumatic.   Eyes:     Conjunctiva/sclera: Conjunctivae normal.  Cardiovascular:     Rate and Rhythm: Normal rate and regular rhythm.     Heart sounds: No murmur  heard. Pulmonary:     Effort: Pulmonary effort is normal. No respiratory distress.     Breath sounds: Normal breath sounds.  Abdominal:     Palpations: Abdomen is soft.     Tenderness: There is no abdominal tenderness.  Musculoskeletal:        General: No swelling.     Cervical back: Neck supple.  Skin:    General: Skin is warm and dry.     Capillary Refill: Capillary refill takes less than 2 seconds.     Comments: Diffuse rash and hives on the upper extremities and neck.  No facial swelling  Neurological:     Mental Status: He is alert.  Psychiatric:        Mood and Affect: Mood normal.      UC Treatments / Results  Labs (all labs ordered are listed, but only abnormal results are displayed) Labs Reviewed - No data to display  EKG   Radiology No results found.  Procedures Procedures (including critical care time)  Medications Ordered in UC Medications  methylPREDNISolone sodium succinate (SOLU-MEDROL) 125 mg/2 mL injection 80 mg (has no administration in time range)    Initial Impression / Assessment and Plan / UC Course  I have reviewed the triage vital signs and the nursing notes.  Pertinent labs & imaging results that were available during my care of the patient were reviewed by me and considered in my medical decision making (see chart for details).     Hives and rash after Bee Stings without respiratory issues: solumedrol given to the patient.  Patient relates that the itching is better.  The patient is very fatigued here in the office after taking Benadryl however he does awaken and appears to be stable for driving home.  We discussed with him that he should follow-up with a PCP to discuss his bee allergy.  Return to clinic if symptoms worsen or fail to resolve. Final Clinical Impressions(s) / UC Diagnoses   Final diagnoses:  None   Discharge Instructions   None    ED Prescriptions   None    PDMP not reviewed this encounter.   Landis Martins,  PA-C 01/04/23 1822    Landis Martins, [place] 01/04/23 1823

## 2023-01-04 NOTE — ED Notes (Signed)
Patient is difficult to arouse, but answers questions appropriately after several attempts to wake.  Marisue Ivan, pa at bedside.

## 2023-02-10 ENCOUNTER — Encounter (HOSPITAL_COMMUNITY): Payer: Self-pay | Admitting: *Deleted

## 2023-02-10 ENCOUNTER — Ambulatory Visit (HOSPITAL_COMMUNITY)
Admission: EM | Admit: 2023-02-10 | Discharge: 2023-02-10 | Disposition: A | Discharge status: Home / Self Care | Payer: BC Managed Care – PPO | Attending: Internal Medicine | Admitting: Internal Medicine

## 2023-02-10 DIAGNOSIS — S91301A Unspecified open wound, right foot, initial encounter: Secondary | ICD-10-CM

## 2023-02-10 MED ORDER — SILVER SULFADIAZINE 1 % EX CREA: 1.0000 | TOPICAL_CREAM | Freq: Two times a day (BID) | CUTANEOUS | 0 refills | Status: DC

## 2023-02-10 MED ORDER — DOXYCYCLINE HYCLATE 100 MG PO CAPS: 100.0000 mg | ORAL_CAPSULE | Freq: Two times a day (BID) | ORAL | 0 refills | Status: DC

## 2023-02-10 NOTE — Discharge Instructions (Signed)
Wash wound twice daily then apply Silvadene cream, cover the area. Follow-up with your doctor for further evaluation of possible vascular disease/venous insufficiency. Elevate your legs above the level of the heart, wear compression socks

## 2023-02-10 NOTE — ED Triage Notes (Signed)
Pt states that he has right ankle/foot pain. He states that he has been putting ointment on it. He states he thinks he was bit by something maybe a week ago.

## 2023-02-10 NOTE — ED Provider Notes (Signed)
[facility]    CSN: [id number] Arrival date & time: 02/10/23  1922      History   Chief Complaint Chief Complaint  Patient presents with   Foot Pain    HPI [name] is a 42 y.o. male.    Foot Pain Pertinent negatives include no chest pain and no shortness of breath.  Has a sore on the inside of his right ankle started with some itching then had a "hole" in the area.  Its tenderness.  Denies known injury, fever.  Had fatigue and nausea several days ago. Admits ongoing lower extremity swelling, he is a Naval architect.  Past medical history includes gout.  Admits similar wound on his foot several years ago.  Past Medical History:  Diagnosis Date   Arthritis    Gout    NGU (nongonococcal urethritis)    Skin infection     Patient Active Problem List   Diagnosis Date Noted   Rheumatoid arthritis (HCC) 09/24/2010   Connective tissue disease (HCC) 09/24/2010   skin infection, bilateral hands 09/05/2010    History reviewed. No pertinent surgical history.     Home Medications    Prior to Admission medications   Medication Sig Start Date End Date Taking? Authorizing Provider  fluticasone (FLONASE) 50 MCG/ACT nasal spray Place 2 sprays into both nostrils daily. Patient not taking: Reported on 10/28/2022 05/27/22   Ward, Tylene Fantasia, PA-C  predniSONE (DELTASONE) 20 MG tablet Take 2 tablets (40 mg total) by mouth daily with breakfast. 01/04/23   Landis Martins, PA-C    Family History Family History  Problem Relation Age of Onset   Arthritis Mother    Cancer Mother    Arthritis Father    Cancer Father    Diabetes Father     Social History Social History   Tobacco Use   Smoking status: Never   Smokeless tobacco: Never  Vaping Use   Vaping status: Never Used  Substance Use Topics   Alcohol use: Yes    Alcohol/week: 2.0 standard drinks of alcohol    Types: 2 Cans of beer per week   Drug use: Yes    Frequency: 3.0 times per week    Types:  Marijuana     Allergies   Other   Review of Systems Review of Systems  Constitutional:  Negative for fever.  Respiratory:  Negative for shortness of breath.   Cardiovascular:  Negative for chest pain.  Musculoskeletal:  Positive for gait problem.       Bilateral lower leg swelling  Skin:  Positive for wound.     Physical Exam Triage Vital Signs ED Triage Vitals  Encounter Vitals Group     BP 02/10/23 1935 135/85     Systolic BP Percentile --      Diastolic BP Percentile --      Pulse Rate 02/10/23 1935 72     Resp 02/10/23 1935 18     Temp 02/10/23 1935 97.9 F (36.6 C)     Temp Source 02/10/23 1935 Oral     SpO2 02/10/23 1935 95 %     Weight --      Height --      Head Circumference --      Peak Flow --      Pain Score 02/10/23 1934 9     Pain Loc --      Pain Education --      Exclude from Growth Chart --  No data found.  Updated Vital Signs BP 135/85 (BP Location: Left Arm)   Pulse 72   Temp 97.9 F (36.6 C) (Oral)   Resp 18   SpO2 95%   Visual Acuity Right Eye Distance:   Left Eye Distance:   Bilateral Distance:    Right Eye Near:   Left Eye Near:    Bilateral Near:     Physical Exam Vitals and nursing note reviewed.  Constitutional:      Appearance: He is obese.  Cardiovascular:     Rate and Rhythm: Normal rate.  Pulmonary:     Effort: Pulmonary effort is normal. No respiratory distress.  Musculoskeletal:     Right lower leg: Edema present.     Left lower leg: Edema present.  Skin:    General: Skin is warm.     Comments: Small open wound right medial ankle, chronic appearing skin changes including hyperpigmentation, minute veins.  Neurological:     Mental Status: He is alert.      UC Treatments / Results  Labs (all labs ordered are listed, but only abnormal results are displayed) Labs Reviewed - No data to display  EKG   Radiology No results found.  Procedures Procedures (including critical care time)  Medications  Ordered in UC Medications - No data to display  Initial Impression / Assessment and Plan / UC Course  I have reviewed the triage vital signs and the nursing notes.  Pertinent labs & imaging results that were available during my care of the patient were reviewed by me and considered in my medical decision making (see chart for details).     Painful wound medial right ankle for several days, has had an ulcer in the past.  Patient is a truck driver, has bilateral pitting edema of lower legs and chronic appearing skin changes consistent with chronic venous insufficiency, recommend he establish with a PCP further evaluation and treatment Final Clinical Impressions(s) / UC Diagnoses   Final diagnoses:  None   Discharge Instructions   None    ED Prescriptions   None    PDMP not reviewed this encounter.   Meliton Rattan, [place] 02/10/23 1956

## 2023-02-13 ENCOUNTER — Encounter: Payer: Self-pay | Admitting: Family Medicine

## 2023-02-13 ENCOUNTER — Ambulatory Visit: Payer: BC Managed Care – PPO | Admitting: Family Medicine

## 2023-02-13 VITALS — BP 138/82 | HR 82 | Ht 75.0 in | Wt 304.0 lb

## 2023-02-13 DIAGNOSIS — Z6838 Body mass index (BMI) 38.0-38.9, adult: Secondary | ICD-10-CM

## 2023-02-13 DIAGNOSIS — E66812 Obesity, class 2: Secondary | ICD-10-CM

## 2023-02-13 DIAGNOSIS — L089 Local infection of the skin and subcutaneous tissue, unspecified: Secondary | ICD-10-CM | POA: Diagnosis not present

## 2023-02-13 DIAGNOSIS — Z0000 Encounter for general adult medical examination without abnormal findings: Secondary | ICD-10-CM

## 2023-02-13 DIAGNOSIS — T148XXA Other injury of unspecified body region, initial encounter: Secondary | ICD-10-CM

## 2023-02-13 NOTE — Patient Instructions (Signed)
Thank you for choosing [facility] at [facility] for your Primary Care needs. I am excited for the opportunity to partner with you to meet your health care goals. It was a pleasure meeting you today!  Information on diet, exercise, and health maintenance recommendations are listed below. This is information to help you be sure you are on track for optimal health and monitoring.   Please look over this and let us know if you have any questions or if you have completed any of the health maintenance outside of [facility] so that we can be sure your records are up to date.  ___________________________________________________________  MyChart:  For all urgent or time sensitive needs we ask that you please call the office to avoid delays. Our number is [phone]. MyChart is not constantly monitored and due to the large volume of messages a day, replies may take up to 72 business hours.  MyChart Policy: MyChart allows for you to see your visit notes, after visit summary, provider recommendations, lab and tests results, make an appointment, request refills, and contact your provider or the office for non-urgent questions or concerns. Providers are seeing patients during normal business hours and do not have built in time to review MyChart messages.  We ask that you allow a minimum of 3 business days for responses to [employer]. For this reason, please do not send urgent requests through MyChart. Please call the office at [phone]. New and ongoing conditions may require a visit. We have virtual and in-person visits available for your convenience.  Complex MyChart concerns may require a visit. Your provider may request you schedule a virtual or in-person visit to ensure we are providing the best care possible. MyChart messages sent after 11:00 AM on Friday will not be received by the provider until Monday morning.    Lab and Test Results: You will receive your lab and test  results on MyChart as soon as they are completed and results have been sent by the lab or testing facility. Due to this service, you will receive your results BEFORE your provider.  I review lab and test results each morning prior to seeing patients. Some results require collaboration with other providers to ensure you are receiving the most appropriate care. For this reason, we ask that you please allow a minimum of 3-5 business days from the time that ALL results have been received for your provider to receive and review lab and test results and contact you about these.  Most lab and test result comments from the provider will be sent through MyChart. Your provider may recommend changes to the plan of care, follow-up visits, repeat testing, ask questions, or request an office visit to discuss these results. You may reply directly to this message or call the office to provide information for the provider or set up an appointment. In some instances, you will be called with test results and recommendations. Please let us know if this is preferred and we will make note of this in your chart to provide this for you.    If you have not heard a response to your lab or test results in 5 business days from all results returning to MyChart, please call the office to let us know. We ask that you please avoid calling prior to this time unless there is an emergent concern. Due to high call volumes, this can delay the resulting process.  After Hours: For all non-emergency after hours needs, please  call the office at [phone] and select the option to reach the on-call  service. On-call services are shared between multiple [facility] offices and therefore it will not be possible to speak directly with your provider. On-call providers may provide medical advice and recommendations, but are unable to provide refills for maintenance medications.  For all emergency or urgent medical needs after normal business hours, we  recommend that you seek care at the closest Urgent Care or Emergency Department to ensure appropriate treatment in a timely manner.  [facility] has a 24 hour emergency room located on the ground floor for your convenience.   Urgent Concerns During the Business Day Providers are seeing patients from 8AM to 5PM with a busy schedule and are most often not able to respond to non-urgent calls until the end of the day or the next business day. If you should have URGENT concerns during the day, please call and speak to the nurse or schedule a same day appointment so that we can address your concern without delay.   Thank you, again, for choosing me as your health care partner. I appreciate your trust and look forward to learning more about you!   Lollie Marrow Reola Calkins, DNP, FNP-C  ___________________________________________________________  Health Maintenance Recommendations Screening Testing Mammogram Every 1-2 years based on history and risk factors Starting at age 59 Pap Smear Ages 21-39 every 3 years Ages 77-65 every 5 years with HPV testing More frequent testing may be required based on results and history Colon Cancer Screening Every 1-10 years based on test performed, risk factors, and history Starting at age 24 Bone Density Screening Every 2-10 years based on history Starting at age 51 for women Recommendations for men differ based on medication usage, history, and risk factors AAA Screening One time ultrasound Men 90-69 years old who have ever smoked Lung Cancer Screening Low Dose Lung CT every 12 months Age 79-80 years with a 20 pack-year smoking history who still smoke or who have quit within the last 15 years  Screening Labs Routine  Labs: Complete Blood Count (CBC), Complete Metabolic Panel (CMP), Cholesterol (Lipid Panel) Every 6-12 months based on history and medications May be recommended more frequently based on current conditions or previous results Hemoglobin  A1c Lab Every 3-12 months based on history and previous results Starting at age 53 or earlier with diagnosis of diabetes, high cholesterol, BMI >26, and/or risk factors Frequent monitoring for patients with diabetes to ensure blood sugar control Thyroid Panel  Every 6 months based on history, symptoms, and risk factors May be repeated more often if on medication HIV One time testing for all patients 45 and older May be repeated more frequently for patients with increased risk factors or exposure Hepatitis C One time testing for all patients 62 and older May be repeated more frequently for patients with increased risk factors or exposure Gonorrhea, Chlamydia Every 12 months for all sexually active persons 13-24 years Additional monitoring may be recommended for those who are considered high risk or who have symptoms PSA Men 80-87 years old with risk factors Additional screening may be recommended from age 61-69 based on risk factors, symptoms, and history  Vaccine Recommendations Tetanus Booster All adults every 10 years Flu Vaccine All patients 6 months and older every year COVID Vaccine All patients 12 years and older Initial dosing with booster May recommend additional booster based on age and health history HPV Vaccine 2 doses all patients age 59-26 Dosing may be considered  for patients over 26 Shingles Vaccine (Shingrix) 2 doses all adults 50 years and older Pneumonia (Pneumovax 23) All adults 65 years and older May recommend earlier dosing based on health history Pneumonia (Prevnar 71) All adults 65 years and older Dosed 1 year after Pneumovax 23 Pneumonia (Prevnar 20) All adults 65 years and older (adults 19-64 with certain conditions or risk factors) 1 dose  For those who have not received Prevnar 13 vaccine previously   Additional Screening, Testing, and Vaccinations may be recommended on an individualized basis based on family history, health history, risk  factors, and/or exposure.  __________________________________________________________  Diet Recommendations for All Patients  I recommend that all patients maintain a diet low in saturated fats, carbohydrates, and cholesterol. While this can be challenging at first, it is not impossible and small changes can make big differences.  Things to try: Decreasing the amount of soda, sweet tea, and/or juice to one or less per day and replace with water While water is always the first choice, if you do not like water you may consider adding a water additive without sugar to improve the taste other sugar free drinks Replace potatoes with a brightly colored vegetable  Use healthy oils, such as canola oil or olive oil, instead of butter or hard margarine Limit your bread intake to two pieces or less a day Replace regular pasta with low carb pasta options Bake, broil, or grill foods instead of frying Monitor portion sizes  Eat smaller, more frequent meals throughout the day instead of large meals  An important thing to remember is, if you love foods that are not great for your health, you don't have to give them up completely. Instead, allow these foods to be a reward when you have done well. Allowing yourself to still have special treats every once in a while is a nice way to tell yourself thank you for working hard to keep yourself healthy.   Also remember that every day is a new day. If you have a bad day and "fall off the wagon", you can still climb right back up and keep moving along on your journey!  We have resources available to help you!  Some websites that may be helpful include: www.http://www.wall-moore.info/  Www.VeryWellFit.com _____________________________________________________________  Activity Recommendations for All Patients  I recommend that all adults get at least 30 minutes of moderate physical activity that elevates your heart rate at least 5 days out of the week.  Some examples  include: Walking or jogging at a pace that allows you to carry on a conversation Cycling (stationary bike or outdoors) Water aerobics Yoga Weight lifting Dancing If physical limitations prevent you from putting stress on your joints, exercise in a pool or seated in a chair are excellent options.  Do determine your MAXIMUM heart rate for activity: 220 - YOUR AGE = MAX Heart Rate   Remember! Do not push yourself too hard.  Start slowly and build up your pace, speed, weight, time in exercise, etc.  Allow your body to rest between exercise and get good sleep. You will need more water than normal when you are exerting yourself. Do not wait until you are thirsty to drink. Drink with a purpose of getting in at least 8, 8 ounce glasses of water a day plus more depending on how much you exercise and sweat.    If you begin to develop dizziness, chest pain, abdominal pain, jaw pain, shortness of breath, headache, vision changes, lightheadedness, or other concerning symptoms,  stop the activity and allow your body to rest. If your symptoms are severe, seek emergency evaluation immediately. If your symptoms are concerning, but not severe, please let us know so that we can recommend further evaluation.

## 2023-02-13 NOTE — Assessment & Plan Note (Signed)
Strong family history of diabetes. No personal history of diabetes or symptoms suggestive of hyperglycemia. Encouraged healthy diet and exercise Labs today

## 2023-02-13 NOTE — Progress Notes (Signed)
New Patient Office Visit  Subjective    Patient ID: [name], male    DOB: Sep 08, 1980  Age: 42 y.o. MRN: [id number]  CC:  Chief Complaint  Patient presents with   Establish Care    HPI TREVEON REINCKE presents to establish care. 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    Discussed the use of AI scribe software for clinical note transcription with the patient, who gave verbal consent to proceed.  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 Initially, the patient noticed a small irritation on the foot, which has since developed into a painful sore. The patient denies any known injury or trauma to the area. The wound is described as painful, but without noticeable drainage. The patient has a history of a similar wound four years ago, which was managed by a wound care clinic with weekly cleanings until resolution.  The patient has been on doxycycline for a few days, prescribed by Urgent Care, but has not noticed significant improvement in the wound. The patient reports that the area around the wound appears to be increasingly red since starting the antibiotic. The patient also notes that his shoes may rub against the wound area, which could potentially exacerbate the issue.  The patient has a family history of diabetes, but denies any personal history of the disease. He reports no increased thirst, hunger, or urination. The patient is a former smoker, having quit in 2004, and denies current alcohol or recreational drug use.  The patient also reports symptoms consistent with Raynaud's disease, including hands turning white in cold weather. The patient denies any loss of sensation or mobility in the toes. He reports some stiffness and discomfort in the legs after long periods of sitting in the truck.              Outpatient Encounter Medications as of 02/13/2023  Medication Sig   doxycycline (VIBRAMYCIN) 100 MG capsule Take 1 capsule (100 mg total) by mouth 2 (two) times daily.   [DISCONTINUED] tadalafil (CIALIS) 10 MG tablet Take 1 tablet by mouth daily.   [DISCONTINUED] fluticasone (FLONASE) 50 MCG/ACT nasal spray Place 2 sprays into both nostrils daily. (Patient not taking: Reported on 10/28/2022)   [DISCONTINUED] predniSONE (DELTASONE) 20 MG tablet Take 2 tablets (40 mg total) by mouth daily with breakfast.   [DISCONTINUED] silver sulfADIAZINE (SILVADENE) 1 % cream Apply 1 Application topically 2 (two) times daily.   No facility-administered encounter medications on file as of 02/13/2023.    Past Medical History:  Diagnosis Date   Arthritis    Gout    NGU (nongonococcal urethritis)    Raynaud's disease    Skin infection     History reviewed. No pertinent surgical history.  Family History  Problem Relation Age of Onset   Arthritis Mother    Arthritis Father    Diabetes Father    Diabetes Maternal Grandmother    Diabetes Paternal Grandmother     Social History   Socioeconomic History   Marital status: Significant Other    Spouse name: Not on file   Number of children: Not on file   Years of education: Not on file   Highest education level: Not on file  Occupational History   Not on file  Tobacco Use   Smoking status: Former  Types: Cigarettes    Start date: 2004   Smokeless tobacco: Never  Vaping Use   Vaping status: Never Used  Substance and Sexual Activity   Alcohol use: Yes    Alcohol/week: 2.0 standard drinks of alcohol    Types: 2 Cans of beer per week    Comment: rarely   Drug use: Not Currently    Frequency: 3.0 times per week    Types: Marijuana   Sexual activity: Yes    Partners: Female    Birth control/protection: Condom  Other Topics Concern   Not on file  Social History Narrative   Not on file   Social Determinants of Health    Financial Resource Strain: Not on file  Food Insecurity: Not on file  Transportation Needs: Not on file  Physical Activity: Not on file  Stress: Not on file  Social Connections: Not on file  Intimate Partner Violence: Not on file    ROS All review of systems negative except what is listed in the HPI      Objective    BP 138/82   Pulse 82   Ht 6\' 3"  (1.905 m)   Wt (!) 304 lb (137.9 kg)   SpO2 100%   BMI 38.00 kg/m   Physical Exam Vitals reviewed.  Constitutional:      General: He is not in acute distress.    Appearance: Normal appearance. He is obese. He is not ill-appearing.  Cardiovascular:     Rate and Rhythm: Normal rate and regular rhythm.     Pulses: Normal pulses.          Dorsalis pedis pulses are 2+ on the right side.  Pulmonary:     Effort: Pulmonary effort is normal.     Breath sounds: Normal breath sounds.  Musculoskeletal:       Feet:  Skin:    General: Skin is warm and dry.     Comments: See picture of right medial lower leg near ankle  Neurological:     Mental Status: He is alert and oriented to person, place, and time.  Psychiatric:        Mood and Affect: Mood normal.        Behavior: Behavior normal.        Thought Content: Thought content normal.        Judgment: Judgment normal.           Assessment & Plan:   Problem List Items Addressed This Visit       Active Problems   Class 2 obesity without serious comorbidity with body mass index (BMI) of 38.0 to 38.9 in adult    Strong family history of diabetes. No personal history of diabetes or symptoms suggestive of hyperglycemia. Encouraged healthy diet and exercise Labs today       Relevant Orders   CBC with Differential/Platelet   Comprehensive metabolic panel   Hemoglobin A1c   TSH   Lipid panel   Other Visit Diagnoses     Wound infection    -  Primary Painful, red, foot wound present for two weeks. Patient has been on doxycycline for a few days with no significant  improvement noted. History of similar wound four years ago treated by wound care clinic. -Continue doxycycline as prescribed by Urgent Care. -Clean wound with soapy water, apply Neosporin, and cover with a band-aid daily. -Return for follow-up after completion of antibiotics or sooner if wound worsens.    Relevant Orders   CBC with  Differential/Platelet   Hemoglobin A1c   Encounter for medical examination to establish care           Return in about 1 week (around 02/20/2023) for wound check.   Clayborne Dana, NP

## 2023-02-14 LAB — CBC WITH DIFFERENTIAL/PLATELET
Basophils Absolute: 0.1 10*3/uL (ref 0.0–0.1)
Basophils Relative: 1.2 % (ref 0.0–3.0)
Eosinophils Absolute: 0.2 10*3/uL (ref 0.0–0.7)
Eosinophils Relative: 3.3 % (ref 0.0–5.0)
HCT: 43.3 % (ref 39.0–52.0)
Hemoglobin: 13.6 g/dL (ref 13.0–17.0)
Lymphocytes Relative: 40.4 % (ref 12.0–46.0)
Lymphs Abs: 2.7 10*3/uL (ref 0.7–4.0)
MCHC: 31.5 g/dL (ref 30.0–36.0)
MCV: 86.3 fL (ref 78.0–100.0)
Monocytes Absolute: 0.7 10*3/uL (ref 0.1–1.0)
Monocytes Relative: 10.6 % (ref 3.0–12.0)
Neutro Abs: 3 10*3/uL (ref 1.4–7.7)
Neutrophils Relative %: 44.5 % (ref 43.0–77.0)
Platelets: 152 10*3/uL (ref 150.0–400.0)
RBC: 5.02 Mil/uL (ref 4.22–5.81)
RDW: 13.9 % (ref 11.5–15.5)
WBC: 6.6 10*3/uL (ref 4.0–10.5)

## 2023-02-14 LAB — TSH: TSH: 0.85 u[IU]/mL (ref 0.35–5.50)

## 2023-02-14 LAB — COMPREHENSIVE METABOLIC PANEL
ALT: 49 U/L (ref 0–53)
AST: 39 U/L — ABNORMAL HIGH (ref 0–37)
Albumin: 4.4 g/dL (ref 3.5–5.2)
Alkaline Phosphatase: 40 U/L (ref 39–117)
BUN: 13 mg/dL (ref 6–23)
CO2: 31 meq/L (ref 19–32)
Calcium: 9.7 mg/dL (ref 8.4–10.5)
Chloride: 103 meq/L (ref 96–112)
Creatinine, Ser: 1.08 mg/dL (ref 0.40–1.50)
GFR: 85.92 mL/min (ref >60.00)
Glucose, Bld: 85 mg/dL (ref 70–99)
Potassium: 4.1 meq/L (ref 3.5–5.1)
Sodium: 141 meq/L (ref 135–145)
Total Bilirubin: 0.4 mg/dL (ref 0.2–1.2)
Total Protein: 7.6 g/dL (ref 6.0–8.3)

## 2023-02-14 LAB — LIPID PANEL
Cholesterol: 149 mg/dL (ref 0–200)
HDL: 28.2 mg/dL — ABNORMAL LOW (ref >39.00)
LDL Cholesterol: 86 mg/dL (ref 0–99)
NonHDL: 120.83
Total CHOL/HDL Ratio: 5
Triglycerides: 174 mg/dL — ABNORMAL HIGH (ref 0.0–149.0)
VLDL: 34.8 mg/dL (ref 0.0–40.0)

## 2023-02-14 LAB — HEMOGLOBIN A1C: Hgb A1c MFr Bld: 6 % (ref 4.6–6.5)

## 2023-02-20 ENCOUNTER — Encounter: Payer: Self-pay | Admitting: Family Medicine

## 2023-02-20 ENCOUNTER — Ambulatory Visit: Payer: BC Managed Care – PPO | Admitting: Family Medicine

## 2023-02-20 VITALS — BP 116/61 | HR 73 | Ht 75.0 in | Wt 304.0 lb

## 2023-02-20 DIAGNOSIS — L089 Local infection of the skin and subcutaneous tissue, unspecified: Secondary | ICD-10-CM

## 2023-02-20 DIAGNOSIS — T148XXA Other injury of unspecified body region, initial encounter: Secondary | ICD-10-CM

## 2023-02-20 MED ORDER — TRAMADOL HCL 50 MG PO TABS
50.0000 mg | ORAL_TABLET | Freq: Three times a day (TID) | ORAL | 0 refills | Status: DC | PRN
Start: 2023-02-20 — End: 2023-02-28

## 2023-02-20 MED ORDER — IBUPROFEN 800 MG PO TABS
800.0000 mg | ORAL_TABLET | Freq: Three times a day (TID) | ORAL | 0 refills | Status: DC | PRN
Start: 2023-02-20 — End: 2023-03-18

## 2023-02-20 NOTE — Progress Notes (Signed)
Acute Office Visit  Subjective:     Patient ID: [name], male    DOB: 29-Jul-1980, 42 y.o.   MRN: [id number]  Chief Complaint  Patient presents with   Medical Management of Chronic Issues    HPI Patient is in today for wound check.   Discussed the use of AI scribe software for clinical note transcription with the patient, who gave verbal consent to proceed.  History of Present Illness   The patient, with a history of foot ulcer, presents near the end of a course of doxycycline for a current foot ulcer. They report no significant improvement, with the wound remaining tender and non-healing. The patient has previously attended a wound clinic for similar issues, where debridement and edge cleaning were performed, leading to eventual healing. 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 Over-the-counter pain medications, including Tylenol and Advil, have been ineffective in managing the pain. The patient has a history of prediabetes, with a recent HbA1c of 6.0. The patient's foot ulcer has been managed with regular cleaning and dressing changes.          ROS All review of systems negative except what is listed in the HPI      Objective:    BP 116/61   Pulse 73   Ht 6\' 3"  (1.905 m)   Wt (!) 304 lb (137.9 kg)   SpO2 99%   BMI 38.00 kg/m    Physical Exam Vitals reviewed.  Constitutional:      Appearance: Normal appearance.  Cardiovascular:     Pulses: Normal pulses.  Skin:    General: Skin is warm and dry.     Capillary Refill: Capillary refill takes less than 2 seconds.     Comments: Wound - see picture below. No drainage  Neurological:     Mental Status: He is alert and oriented to person, place, and time.  Psychiatric:        Mood and Affect: Mood normal.        Behavior: Behavior normal.        Thought Content: Thought content normal.        Judgment:  Judgment normal.      No results found for any visits on 02/20/23.      Assessment & Plan:   Problem List Items Addressed This Visit   None Visit Diagnoses     Wound infection    -  Primary   Relevant Medications   ibuprofen (ADVIL) 800 MG tablet   traMADol (ULTRAM) 50 MG tablet   Other Relevant Orders   Ambulatory referral to Wound Clinic         Foot Ulcer Not healing despite antibiotic therapy (Doxycycline). No signs of systemic infection. Good peripheral pulses. History of similar ulcers, previously managed at wound clinic. -Refer to wound clinic for further management -Finish last day of Doxycycline. -Encourage high protein, low-carb diet for wound healing.  Pain Management Severe throbbing pain in foot, not relieved by over-the-counter analgesics. Pain affecting sleep and work. -Prescribe Ibuprofen 800mg  up to three times a day. -Prescribe Tramadol for use at night. Cautioned against use while driving - may cause drowsiness.          Meds ordered this encounter  Medications   ibuprofen (ADVIL) 800 MG tablet    Sig: Take 1 tablet (800 mg total) by mouth every 8 (eight) hours as needed.  Dispense:  30 tablet    Refill:  0    Order Specific Question:   Supervising Provider    Answer:   Danise Edge A [[id number]]   traMADol (ULTRAM) 50 MG tablet    Sig: Take 1 tablet (50 mg total) by mouth every 8 (eight) hours as needed.    Dispense:  10 tablet    Refill:  0    Order Specific Question:   Supervising Provider    Answer:   Danise Edge A [[id number]]    Return if symptoms worsen or fail to improve.  Clayborne Dana, NP

## 2023-02-27 ENCOUNTER — Telehealth: Payer: Self-pay | Admitting: Family Medicine

## 2023-02-27 DIAGNOSIS — L089 Local infection of the skin and subcutaneous tissue, unspecified: Secondary | ICD-10-CM

## 2023-02-27 NOTE — Telephone Encounter (Signed)
Please advise on ongoing meds.

## 2023-02-27 NOTE — Telephone Encounter (Signed)
Pt called and stated that he will be out of his pain medication, traMADol (ULTRAM) 50 MG tablet and ibuprofen (ADVIL) 800 MG tablet by the end of this weekend. Patient is using   [id number] [place], [place] - [phone] AT [facility] OF [facility] & [address] [place] [address] Phone: [phone]  Fax: [phone]   Please advise. He stated his appt with ortho is not until late December.

## 2023-02-28 MED ORDER — TRAMADOL HCL 50 MG PO TABS
50.0000 mg | ORAL_TABLET | Freq: Three times a day (TID) | ORAL | 0 refills | Status: DC | PRN
Start: 2023-02-28 — End: 2023-03-18

## 2023-02-28 NOTE — Addendum Note (Signed)
Addended by: Hyman Hopes B on: 02/28/2023 01:42 PM   Modules accepted: Orders

## 2023-02-28 NOTE — Telephone Encounter (Signed)
Tried to contact patient to make him aware. Phone disconnected.

## 2023-03-10 ENCOUNTER — Emergency Department (HOSPITAL_COMMUNITY)
Admission: EM | Admit: 2023-03-10 | Discharge: 2023-03-11 | Disposition: A | Discharge status: Home / Self Care | Payer: BC Managed Care – PPO | Attending: Emergency Medicine | Admitting: Emergency Medicine

## 2023-03-10 ENCOUNTER — Encounter (HOSPITAL_COMMUNITY): Payer: Self-pay

## 2023-03-10 ENCOUNTER — Other Ambulatory Visit: Payer: Self-pay

## 2023-03-10 DIAGNOSIS — L500 Allergic urticaria: Secondary | ICD-10-CM | POA: Insufficient documentation

## 2023-03-10 DIAGNOSIS — T782XXA Anaphylactic shock, unspecified, initial encounter: Secondary | ICD-10-CM | POA: Insufficient documentation

## 2023-03-10 DIAGNOSIS — T7840XA Allergy, unspecified, initial encounter: Secondary | ICD-10-CM | POA: Diagnosis present

## 2023-03-10 MED ORDER — ONDANSETRON HCL 4 MG/2ML IJ SOLN
4.0000 mg | Freq: Once | INTRAMUSCULAR | Status: AC
  Administered 2023-03-10: 4 mg via INTRAVENOUS

## 2023-03-10 MED ORDER — METHYLPREDNISOLONE SODIUM SUCC 125 MG IJ SOLR
125.0000 mg | Freq: Once | INTRAMUSCULAR | Status: AC
  Administered 2023-03-10: 125 mg via INTRAVENOUS

## 2023-03-10 MED ORDER — FAMOTIDINE IN NACL 20-0.9 MG/50ML-% IV SOLN
20.0000 mg | Freq: Once | INTRAVENOUS | Status: AC
  Administered 2023-03-10: 20 mg via INTRAVENOUS

## 2023-03-10 MED ORDER — DIPHENHYDRAMINE HCL 50 MG/ML IJ SOLN
50.0000 mg | Freq: Once | INTRAMUSCULAR | Status: AC
  Administered 2023-03-10: 50 mg via INTRAVENOUS

## 2023-03-10 MED ORDER — EPINEPHRINE 0.3 MG/0.3ML IJ SOAJ
0.3000 mg | Freq: Once | INTRAMUSCULAR | Status: AC
  Administered 2023-03-10: 0.3 mg via INTRAMUSCULAR

## 2023-03-10 NOTE — ED Triage Notes (Signed)
Says he ate some shrimp tonight and then went to bed. Woke up just prior to arrival with severe itching, lip swelling, hives, throat tightening sensation, stuffy nose and subjective vocal changes.

## 2023-03-10 NOTE — ED Provider Notes (Incomplete)
[name] AT [name] Provider Note   CSN: [id number] Arrival date & time: 03/10/23  2306     History  Chief Complaint  Patient presents with  . Allergic Reaction    [name] is a 42 y.o. male.   Allergic Reaction  Patient presents to the ED complaining of allergic reaction that started 1 hour ago when he woke up after eating shrimp earlier in the evening.  He says that he began experiencing facial swelling, hives and itching all over his body and immediately came to the ER.  Where he then became slightly disoriented and lethargic in the waiting room and was brought back to a room.  No previous documented allergy to shellfish or shrimp.  However he thinks this is what caused the symptoms.  Previous allergies noted to bee stings which he was treated for in September but is unsure if he was ever prescribed an EpiPen.  Denies abdominal pain, vomiting, diarrhea, shortness of breath, chest pain.   Home Medications Prior to Admission medications   Medication Sig Start Date End Date Taking? Authorizing Provider  doxycycline (VIBRAMYCIN) 100 MG capsule Take 1 capsule (100 mg total) by mouth 2 (two) times daily. 02/10/23   Meliton Rattan, PA  ibuprofen (ADVIL) 800 MG tablet Take 1 tablet (800 mg total) by mouth every 8 (eight) hours as needed. 02/20/23   Clayborne Dana, NP  traMADol (ULTRAM) 50 MG tablet Take 1 tablet (50 mg total) by mouth every 8 (eight) hours as needed. 02/28/23   Clayborne Dana, NP      Allergies    Other    Review of Systems   Review of Systems  HENT:  Positive for facial swelling and voice change.   Allergic/Immunologic: Positive for environmental allergies and food allergies.  All other systems reviewed and are negative.   Physical Exam Updated Vital Signs BP (!) 141/80   Pulse (!) 125   Resp (!) 30   Ht 6\' 3"  (1.905 m)   Wt 136.1 kg   SpO2 90%   BMI 37.50 kg/m  Physical Exam  ED Results / Procedures /  Treatments   Labs (all labs ordered are listed, but only abnormal results are displayed) Labs Reviewed - No data to display  EKG None  Radiology No results found.  Procedures .Critical Care  Performed by: Lunette Stands, PA-C Authorized by: Lunette Stands, PA-C   Critical care provider statement:    Critical care time was exclusive of:  Separately billable procedures and treating other patients   Critical care was necessary to treat or prevent imminent or life-threatening deterioration of the following conditions: Anaphylaxis.   Critical care was time spent personally by me on the following activities:  Ordering and performing treatments and interventions, development of treatment plan with patient or surrogate, pulse oximetry, evaluation of patient's response to treatment, re-evaluation of patient's condition, examination of patient, review of old charts and obtaining history from patient or surrogate   I assumed direction of critical care for this patient from another provider in my specialty: no       Medications Ordered in ED Medications  famotidine (PEPCID) IVPB 20 mg premix (20 mg Intravenous New Bag/Given 03/10/23 2338)  diphenhydrAMINE (BENADRYL) injection 50 mg (50 mg Intravenous Given 03/10/23 2322)  methylPREDNISolone sodium succinate (SOLU-MEDROL) 125 mg/2 mL injection 125 mg (125 mg Intravenous Given 03/10/23 2333)  ondansetron (ZOFRAN) injection 4 mg (4 mg Intravenous Given 03/10/23 2331)  EPINEPHrine (EPI-PEN) injection 0.3 mg (0.3 mg Intramuscular Given 03/10/23 2331)    ED Course/ Medical Decision Making/ A&P   {   Click here for ABCD2, HEART and other calculatorsREFRESH Note before signing :1}                              Medical Decision Making Risk Prescription drug management.   ***  {Document critical care time when appropriate:1} {Document review of labs and clinical decision tools ie heart score, Chads2Vasc2 etc:1}  {Document your independent  review of radiology images, and any outside records:1} {Document your discussion with family members, caretakers, and with consultants:1} {Document social determinants of health affecting pt's care:1} {Document your decision making why or why not admission, treatments were needed:1} Final Clinical Impression(s) / ED Diagnoses Final diagnoses:  None    Rx / DC Orders ED Discharge Orders     None

## 2023-03-10 NOTE — ED Provider Notes (Cosign Needed Addendum)
[place] EMERGENCY DEPARTMENT AT [facility] Provider Note   CSN: [id number] Arrival date & time: 03/10/23  2306     History  Chief Complaint  Patient presents with   Allergic Reaction    [name] is a 42 y.o. male.   Allergic Reaction  Patient presents to the ED complaining of allergic reaction that started 1 hour ago when he woke up after eating shrimp earlier in the evening.  He says that he began experiencing facial swelling, hives and itching all over his body and immediately came to the ER.  Where he then became slightly disoriented and lethargic in the waiting room and was brought back to a room.  No previous documented allergy to shellfish or shrimp.  However he thinks this is what caused the symptoms.  Previous allergies noted to bee stings which he was treated for in September but is unsure if he was ever prescribed an EpiPen.  Denies abdominal pain, vomiting, diarrhea, shortness of breath, chest pain.   Home Medications Prior to Admission medications   Medication Sig Start Date End Date Taking? Authorizing Provider  EPINEPHrine 0.3 mg/0.3 mL IJ SOAJ injection Inject 0.3 mg into the muscle as needed for anaphylaxis. 03/11/23  Yes Lunette Stands, PA-C  doxycycline (VIBRAMYCIN) 100 MG capsule Take 1 capsule (100 mg total) by mouth 2 (two) times daily. 02/10/23   Meliton Rattan, PA  ibuprofen (ADVIL) 800 MG tablet Take 1 tablet (800 mg total) by mouth every 8 (eight) hours as needed. 02/20/23   Clayborne Dana, NP  traMADol (ULTRAM) 50 MG tablet Take 1 tablet (50 mg total) by mouth every 8 (eight) hours as needed. 02/28/23   Clayborne Dana, NP      Allergies    Other    Review of Systems   Review of Systems  HENT:  Positive for facial swelling and voice change.   Allergic/Immunologic: Positive for environmental allergies and food allergies.  All other systems reviewed and are negative.   Physical Exam Updated Vital Signs BP 130/69   Pulse 65    Temp (!) 97.5 F (36.4 C) (Oral)   Resp 14   Ht 6\' 3"  (1.905 m)   Wt 136.1 kg   SpO2 98%   BMI 37.50 kg/m  Physical Exam Vitals and nursing note reviewed.  Constitutional:      General: He is in acute distress.  HENT:     Head: Normocephalic and atraumatic.     Comments: Angioedema with significant edema around eyes, lips.    Mouth/Throat:     Mouth: Mucous membranes are moist.     Comments: Lips, tongue edematous. Eyes:     General:        Right eye: No discharge.        Left eye: No discharge.     Extraocular Movements: Extraocular movements intact.     Conjunctiva/sclera: Conjunctivae normal.  Cardiovascular:     Rate and Rhythm: Normal rate and regular rhythm.     Pulses: Normal pulses.     Heart sounds: Normal heart sounds. No murmur heard.    No friction rub. No gallop.  Pulmonary:     Effort: Pulmonary effort is normal. No respiratory distress.     Breath sounds: Normal breath sounds. No stridor.  Abdominal:     General: Abdomen is flat.     Palpations: Abdomen is soft.     Tenderness: There is no abdominal tenderness.  Musculoskeletal:  Cervical back: Normal range of motion.  Skin:    General: Skin is warm and dry.     Findings: Erythema and rash present.     Comments: Urticaria noted around mouth, anterior chest, bilateral arms.  Neurological:     General: No focal deficit present.     Mental Status: He is alert. Mental status is at baseline.  Psychiatric:        Mood and Affect: Mood normal.     ED Results / Procedures / Treatments   Labs (all labs ordered are listed, but only abnormal results are displayed) Labs Reviewed - No data to display  EKG None  Radiology No results found.  Procedures .Critical Care  Performed by: Lunette Stands, PA-C Authorized by: Lunette Stands, PA-C   Critical care provider statement:    Critical care time (minutes):  36   Critical care start time:  03/10/2023 11:55 PM   Critical care end time:   03/11/2023 12:31 AM   Critical care time was exclusive of:  Separately billable procedures and treating other patients   Critical care was necessary to treat or prevent imminent or life-threatening deterioration of the following conditions: Anaphylaxis.   Critical care was time spent personally by me on the following activities:  Ordering and performing treatments and interventions, development of treatment plan with patient or surrogate, pulse oximetry, evaluation of patient's response to treatment, re-evaluation of patient's condition, examination of patient, review of old charts and obtaining history from patient or surrogate   I assumed direction of critical care for this patient from another provider in my specialty: no       Medications Ordered in ED Medications  diphenhydrAMINE (BENADRYL) injection 50 mg (50 mg Intravenous Given 03/10/23 2322)  methylPREDNISolone sodium succinate (SOLU-MEDROL) 125 mg/2 mL injection 125 mg (125 mg Intravenous Given 03/10/23 2333)  ondansetron (ZOFRAN) injection 4 mg (4 mg Intravenous Given 03/10/23 2331)  famotidine (PEPCID) IVPB 20 mg premix (0 mg Intravenous Stopped 03/11/23 0018)  EPINEPHrine (EPI-PEN) injection 0.3 mg (0.3 mg Intramuscular Given 03/10/23 2331)    ED Course/ Medical Decision Making/ A&P                                 Medical Decision Making Risk Prescription drug management.    Patient presents to the ED for concern of allergic reaction, this involves an extensive number of treatment options, and is a complaint that carries with it a high risk of complications and morbidity.  The differential diagnosis includes allergic reaction, anaphylaxis, foreign body,   Co morbidities that complicate the patient evaluation  RA   Additional history obtained:  Additional history obtained from  Nursing, Outside Medical Records, and Past Admission     Lab Tests:  No labs are necessary for this visit  Imaging Studies  ordered:  No imaging studies are necessary for this visit  Cardiac Monitoring:  The patient was maintained on a cardiac monitor.  I personally viewed and interpreted the cardiac monitored which showed an underlying rhythm of: NSR   Medicines ordered and prescription drug management:  I ordered medication including epinephrine, methylprednisone, famotidine, Benadryl for anaphylaxis Reevaluation of the patient after these medicines showed that the patient improved I have reviewed the patients home medicines and have made adjustments as needed   Critical Interventions:  Epinephrine, methylprednisone, famotidine, Benadryl for anaphylaxis were immediately given upon arrival to the ER.Marland Kitchen  Problem List / ED Course:  Anaphylaxis --patient is a 35-year-old male presents to the ED today complaining of angioedema, throat tightness, body itching after eating shrimp 1 hour prior.  He says he woke him up from sleep and came to the ER himself.  He states he has had previous allergic reactions in the past to bee stings but has never had 1 to shrimp before.  Upon arrival to the ER, he was treated with methylprednisone, famotidine, Benadryl, epinephrine 1 treatment.  He subsequently was then monitored for 3 hours with no worsening symptoms.  He states he "feels much better".  He then was prescribed an EpiPen and told to avoid shellfish and to follow-up with PCP for coordinating possible allergen testing.  Patient was told of strict return to ER precautions.  Patient discharged.  Reevaluation:  After the interventions noted above, I reevaluated the patient and found that they have :improved    Dispostion:  After consideration of the diagnostic results and the patients response to treatment, I feel that the patent would benefit from treatment as above and discharged with EpiPen.    Final Clinical Impression(s) / ED Diagnoses Final diagnoses:  Anaphylaxis, initial encounter    Rx / DC Orders ED  Discharge Orders          Ordered    EPINEPHrine 0.3 mg/0.3 mL IJ SOAJ injection  As needed        03/11/23 0328              Lunette Stands, PA-C 03/11/23 0412    Lunette Stands, PA-C 03/12/23 0109    Tilden Fossa, MD 03/13/23 1014

## 2023-03-11 ENCOUNTER — Telehealth: Payer: Self-pay | Admitting: Family Medicine

## 2023-03-11 MED ORDER — EPINEPHRINE 0.3 MG/0.3ML IJ SOAJ: 0.3000 mg | INTRAMUSCULAR | 0 refills | Status: DC | PRN

## 2023-03-11 NOTE — Telephone Encounter (Signed)
Spoke with patient, made aware he needs to keep appt tomorrow to discuss. Advised to use OTC Ibuprofen.

## 2023-03-11 NOTE — Discharge Instructions (Addendum)
Avoid eating shellfish, shrimp  Prescribed EpiPen.  Be sure to pick that up from the pharmacy at your earliest convenience.  Apply EpiPen as discussed when experiencing allergic reaction and read the instructions on how to apply EpiPen if needing further instruction.  Return to the ER for worsening hives, shortness of breath, throat tightness, facial swelling, chest pain.    Follow-up with PCP for considering possible further allergy testing.

## 2023-03-11 NOTE — Telephone Encounter (Signed)
Pt is scheduled tmr but is completely out of rx. Please advise if okay to fill.    ibuprofen (ADVIL) 800 MG tablet traMADol (ULTRAM) 50 MG tablet  [facility] #[id number] [place], [place] - [address] AT [facility] OF [facility] & [address] [place], [place] [address] Phone: [phone]  Fax: [phone]

## 2023-03-12 ENCOUNTER — Ambulatory Visit: Payer: BC Managed Care – PPO | Admitting: Family Medicine

## 2023-03-18 ENCOUNTER — Encounter: Payer: Self-pay | Admitting: Family Medicine

## 2023-03-18 ENCOUNTER — Ambulatory Visit (HOSPITAL_BASED_OUTPATIENT_CLINIC_OR_DEPARTMENT_OTHER)
Admission: RE | Admit: 2023-03-18 | Discharge: 2023-03-18 | Disposition: A | Discharge status: Home / Self Care | Payer: BC Managed Care – PPO | Source: Ambulatory Visit | Attending: Family Medicine | Admitting: Family Medicine

## 2023-03-18 ENCOUNTER — Ambulatory Visit: Payer: BC Managed Care – PPO | Admitting: Family Medicine

## 2023-03-18 VITALS — BP 129/79 | HR 96 | Ht 75.0 in | Wt 299.0 lb

## 2023-03-18 DIAGNOSIS — T148XXA Other injury of unspecified body region, initial encounter: Secondary | ICD-10-CM | POA: Insufficient documentation

## 2023-03-18 DIAGNOSIS — L089 Local infection of the skin and subcutaneous tissue, unspecified: Secondary | ICD-10-CM

## 2023-03-18 DIAGNOSIS — S91001A Unspecified open wound, right ankle, initial encounter: Secondary | ICD-10-CM

## 2023-03-18 DIAGNOSIS — T7840XA Allergy, unspecified, initial encounter: Secondary | ICD-10-CM | POA: Diagnosis not present

## 2023-03-18 DIAGNOSIS — M25531 Pain in right wrist: Secondary | ICD-10-CM | POA: Insufficient documentation

## 2023-03-18 DIAGNOSIS — Z09 Encounter for follow-up examination after completed treatment for conditions other than malignant neoplasm: Secondary | ICD-10-CM

## 2023-03-18 LAB — CBC WITH DIFFERENTIAL/PLATELET
Basophils Absolute: 0 10*3/uL (ref 0.0–0.1)
Basophils Relative: 0.7 % (ref 0.0–3.0)
Eosinophils Absolute: 0.2 10*3/uL (ref 0.0–0.7)
Eosinophils Relative: 4.6 % (ref 0.0–5.0)
HCT: 43 % (ref 39.0–52.0)
Hemoglobin: 13.7 g/dL (ref 13.0–17.0)
Lymphocytes Relative: 40.4 % (ref 12.0–46.0)
Lymphs Abs: 2.1 10*3/uL (ref 0.7–4.0)
MCHC: 31.9 g/dL (ref 30.0–36.0)
MCV: 86.6 fL (ref 78.0–100.0)
Monocytes Absolute: 0.6 10*3/uL (ref 0.1–1.0)
Monocytes Relative: 11.9 % (ref 3.0–12.0)
Neutro Abs: 2.2 10*3/uL (ref 1.4–7.7)
Neutrophils Relative %: 42.4 % — ABNORMAL LOW (ref 43.0–77.0)
Platelets: 144 10*3/uL — ABNORMAL LOW (ref 150.0–400.0)
RBC: 4.97 Mil/uL (ref 4.22–5.81)
RDW: 13.6 % (ref 11.5–15.5)
WBC: 5.1 10*3/uL (ref 4.0–10.5)

## 2023-03-18 LAB — COMPREHENSIVE METABOLIC PANEL
ALT: 43 U/L (ref 0–53)
AST: 39 U/L — ABNORMAL HIGH (ref 0–37)
Albumin: 4.4 g/dL (ref 3.5–5.2)
Alkaline Phosphatase: 38 U/L — ABNORMAL LOW (ref 39–117)
BUN: 16 mg/dL (ref 6–23)
CO2: 31 meq/L (ref 19–32)
Calcium: 9.7 mg/dL (ref 8.4–10.5)
Chloride: 105 meq/L (ref 96–112)
Creatinine, Ser: 0.97 mg/dL (ref 0.40–1.50)
GFR: 97.68 mL/min (ref >60.00)
Glucose, Bld: 66 mg/dL — ABNORMAL LOW (ref 70–99)
Potassium: 4 meq/L (ref 3.5–5.1)
Sodium: 141 meq/L (ref 135–145)
Total Bilirubin: 0.4 mg/dL (ref 0.2–1.2)
Total Protein: 7.6 g/dL (ref 6.0–8.3)

## 2023-03-18 MED ORDER — IBUPROFEN 800 MG PO TABS
800.0000 mg | ORAL_TABLET | Freq: Three times a day (TID) | ORAL | 2 refills | Status: DC | PRN
Start: 2023-03-18 — End: 2023-04-29

## 2023-03-18 NOTE — Progress Notes (Signed)
Established Patient Office Visit  Subjective   Patient ID: [name], male    DOB: 07/23/1980  Age: 42 y.o. MRN: [id number]  Chief Complaint  Patient presents with   Medical Management of Chronic Issues    HPI  Patient is here for ED follow-up.   He was at [facility] ED on 03/10/23 for allergic reaction after eating shrimp earlier in the evening (no previously documented shrimp/shellfish allergy). Symptoms included facial swelling, throat tightness, and generalized hives and itching. While in the ED waiting room he became disoriented and lethargic. He was treated with IV Benadryl, Solu-Medrol, Zofran, Pepcid, and IM Epi. He improved quickly and was monitored for a few hours before discharging home. He was prescribed an EpiPen at discharge.    Discussed the use of AI scribe software for clinical note transcription with the patient, who gave verbal consent to proceed.  History of Present Illness   The patient, with a recent episode of anaphylaxis, presents with uncertainty about the trigger. They report a history of consuming shrimp without any adverse reactions, but following a recent meal of shrimp, broccoli, and beans, they experienced severe allergic symptoms. The patient describes facial and throat swelling, hives, itching, and disorientation, which progressed to lethargy. They also experienced difficulty breathing, which led to a visit to the ED where they received treatment. The patient is awaiting insurance approval for an EpiPen prescription.  In addition to the allergic reaction, the patient is dealing with a painful, non-healing wound on their foot (right medial ankle). They describe the pain as a burning sensation, likening it to a lighter being held to their foot. The pain is constant, intensifying with movement, and radiates throughout the entire leg. The patient has been managing the pain with ibuprofen, but reports that it is not providing sufficient relief. They have an  upcoming appointment with a wound care specialist, but express frustration at the delay in receiving care. He would like to see if another office can get him in sooner.  The patient also reports pain in their right wrist, which they describe as a popping sensation that impairs their grip strength. The pain is localized in the middle of the wrist and does not present with any swelling, numbness, or tingling. The patient has previously sought medical attention for this issue, but the pain persists.           ROS All review of systems negative except what is listed in the HPI    Objective:     BP 129/79   Pulse 96   Ht 6\' 3"  (1.905 m)   Wt 299 lb (135.6 kg)   SpO2 99%   BMI 37.37 kg/m    Physical Exam Vitals reviewed.  Constitutional:      Appearance: Normal appearance.  HENT:     Head: Normocephalic and atraumatic.  Cardiovascular:     Pulses: Normal pulses.  Pulmonary:     Effort: Pulmonary effort is normal.  Musculoskeletal:        General: Normal range of motion.  Skin:    Comments: Tender around wound of right medial ankle  Neurological:     Mental Status: He is alert and oriented to person, place, and time.  Psychiatric:        Mood and Affect: Mood normal.        Behavior: Behavior normal.        Thought Content: Thought content normal.        Judgment: Judgment normal.  No results found for any visits on 03/18/23.    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 Patient reports eating shrimp, broccoli, and beans prior to the episode. No known history of food allergies. Treated in the ED with Benadryl, Zofran, Pepcid, Epi, and steroids. EpiPen prescribed but not yet picked up due to insurance issues. -Refer to allergist  for allergy testing. -Advise patient to pick up EpiPen from pharmacy and carry it at all times. Notify us if we need to change prescription information.     Relevant Orders   Ambulatory referral to Allergy   Wound infection     Chronic wound on foot with severe pain. Patient reports burning pain at wound. No signs of systemic infection. Patient has a wound care appointment scheduled for 04/08/2023. -Order blood work to check for signs of systemic infection. -Order x-ray of the foot  -Swab wound for culture. Previously treated with doxycycline. -Prescribe ibuprofen 800mg  for pain.  -He is open to going to the [facility] wound clinic if they can get him in sooner.    Relevant Medications   ibuprofen (ADVIL) 800 MG tablet   Other Relevant Orders   DG Ankle Complete Right (Completed)   Comprehensive metabolic panel   CBC with Differential/Platelet   Ambulatory referral to Wound Clinic   WOUND CULTURE   Hospital discharge follow-up     See above   Relevant Orders   Ambulatory referral to Allergy   Right wrist pain     No abnormalities on exam. Xray today. Supportive measures. Continue NSAID, ice, stretching.    Relevant Orders   DG Wrist Complete Right (Completed)       Return if symptoms worsen or fail to improve.    Clayborne Dana, NP

## 2023-03-21 LAB — WOUND CULTURE
MICRO NUMBER:: 15801997
SPECIMEN QUALITY:: ADEQUATE

## 2023-04-08 ENCOUNTER — Encounter (HOSPITAL_BASED_OUTPATIENT_CLINIC_OR_DEPARTMENT_OTHER): Payer: BC Managed Care – PPO | Attending: General Surgery | Admitting: Internal Medicine

## 2023-04-08 DIAGNOSIS — L97818 Non-pressure chronic ulcer of other part of right lower leg with other specified severity: Secondary | ICD-10-CM | POA: Insufficient documentation

## 2023-04-08 DIAGNOSIS — I7300 Raynaud's syndrome without gangrene: Secondary | ICD-10-CM | POA: Insufficient documentation

## 2023-04-08 DIAGNOSIS — M109 Gout, unspecified: Secondary | ICD-10-CM | POA: Insufficient documentation

## 2023-04-08 DIAGNOSIS — M069 Rheumatoid arthritis, unspecified: Secondary | ICD-10-CM | POA: Diagnosis not present

## 2023-04-08 NOTE — Progress Notes (Addendum)
[name] ([id number]) [id number] Page 1 of 11 Visit Report for 04/08/2023 Chief Complaint Document Details Patient Name: Date of Service: [name] MES Vasquez. 04/08/2023 8:00 A M Medical Record Number: [id number] Patient Account Number: [id number] Date of Birth/Sex: Treating RN: June 11, 1980 (42 y.o. M) Primary Care Provider: Hyman Hopes Other Clinician: Referring Provider: Treating Provider/Extender: Arletha Grippe in Treatment: 0 Information Obtained from: Patient Chief Complaint Left lateral foot ulcer 12/08/17; patient is here for review of a wound on his right lateral foot and left medial malleolus 04/08/2023; patient returns to our clinic with a painful wound on the right medial ankle Electronic Signature(s) Signed: 04/08/2023 11:51:18 AM By: Baltazar Najjar MD Entered By: Baltazar Najjar on 04/08/2023 05:52:53 -------------------------------------------------------------------------------- Debridement Details Patient Name: Date of Service: [name] MES Vasquez. 04/08/2023 8:00 A M Medical Record Number: [id number] Patient Account Number: [id number] Date of Birth/Sex: Treating RN: June 03, 1980 (42 y.o. [name], [name] Primary Care Provider: Hyman Hopes Other Clinician: Referring Provider: Treating Provider/Extender: Arletha Grippe in Treatment: 0 Debridement Performed for Assessment: Wound #4 Right,Medial Lower Leg Performed By: Physician Maxwell Caul., MD The following information was scribed by: Shawn Stall The information was scribed for: Baltazar Najjar Debridement Type: Debridement Severity of Tissue Pre Debridement: Fat layer exposed Level of Consciousness (Pre-procedure): Awake and Alert Pre-procedure Verification/Time Out Yes - 08:30 Taken: Start Time: 08:31 Pain Control: Lidocaine 4% T opical Solution Percent of Wound Bed Debrided: 100% T Area Debrided (cm): otal 1.57 Tissue and other  material debrided: Viable, Non-Viable, Slough, Skin: Dermis , Skin: Epidermis, Slough Level: Skin/Epidermis Debridement Description: Selective/Open Wound Instrument: Curette Bleeding: Minimum Hemostasis Achieved: Pressure End Time: 08:40 Procedural Pain: 0 Post Procedural Pain: 0 Response to Treatment: Procedure was tolerated well Level of Consciousness (Post- Awake and Alert procedure): Post Debridement Measurements of Total Wound Length: (cm) 2 [name] ([id number]) [phone].pdf Page 2 of 11 Width: (cm) 1 Depth: (cm) 0.1 Volume: (cm) 0.157 Character of Wound/Ulcer Post Debridement: Improved Severity of Tissue Post Debridement: Fat layer exposed Post Procedure Diagnosis Same as Pre-procedure Electronic Signature(s) Signed: 04/08/2023 11:51:18 AM By: Baltazar Najjar MD Signed: 04/08/2023 12:25:14 PM By: Shawn Stall RN, BSN Entered By: Shawn Stall on 04/08/2023 05:41:03 -------------------------------------------------------------------------------- HPI Details Patient Name: Date of Service: [name] MES Vasquez. 04/08/2023 8:00 A M Medical Record Number: [id number] Patient Account Number: [id number] Date of Birth/Sex: Treating RN: 1980-09-23 (42 y.o. M) Primary Care Provider: Hyman Hopes Other Clinician: Referring Provider: Treating Provider/Extender: Arletha Grippe in Treatment: 0 History of Present Illness HPI Description: 08/06/17 on evaluation today patient presents for a left lateral foot ulcer which he states began as what sounds to be an abscess. He was seen in the ER in March but states this actually first showed up in February. He showed it to a friend and they felt that it might be a "spider bite". He eventually did go to the ER and that encounter was on 07/24/17. The wound that time was much larger than what it appears to be at this point based on the picture that I did review today in his chart. Nonetheless he  was placed on antibiotics although I'm not sure what these antibiotics were as I cannot find them in the computer system at this point. He does have significant discomfort unfortunately. Patient has a history of rheumatoid arthritis with connective tissue disease but otherwise no significant history he is not a current smoker  although he was formally he also no longer uses marijuana which he had in the past. He quit in 2012. He has been basically putting Vaseline on the area but nothing else has been done recently. He obviously was covering this with the bandage. 08/13/17 on evaluation today patient appears to be doing very good in regard to his left lateral lower extremity/foot ulcer. He has been tolerating the dressing changes without complication and overall this appears to be smaller today. He does still continue to have significant pain he continues to work full duty he does work in [employer]. 08/20/17 on evaluation today patient appears to be doing very well in regard to his left lateral foot ulcer. He has been tolerating the dressing changes without complication the Prisma does seem to be helping. With that being said he does still continue to have pain mainly in the posterior wound the more anterior wound is not really hurting him. Fortunately there is no evidence of infection showing up at this point. READMISSION 12/08/17 Is a patient that we had in clinic for a few visits in May of this year. 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 He tells me he was seen by her rheumatologist while he was incarcerated at [facility] several years ago we made this diagnosis. I don't have any of this information available. By talking to the patient he does clearly have Raynaud's phenomenon very cold sensitive he has had wounds in his hands and noted discoloration. On  this occasion he developed a wound on his right lateral foot in June this started as a purplish discoloration and then expanded. It is very painful he is been using peroxide and a bandage. He developed another wound on the left medial malleolus 2-3 weeks ago. He is in the ER on July 23 and was given a prescription for doxycycline. He may have received additional antibiotics from his primary doctor nobody is really sure about this. He has a history of gout, Raynaud's and connective tissue diseaseo Overlap syndrome. He has an ABI on the right of 1.09 and on the left at 1.18. He works in a [employer] essentially works 7 days a week 12/22/17; his wounds measures smaller but not much change in overall appearance. On the right lateral foot he has 2 open areas. The more proximal one has depth and perhaps a small superficial tendon at the base. The distal one has no depth and the surface looks quite good. Left lateral malleolus measuring smaller. We've been using silver collagen 12/29/17; not much improvement. He has 3 open areas on the right lateral foot with close proximity to each other. The more proximal one as some depth the others are superficial. The area over the left medial ankle/medial malleolus has some fixed skin around the circumference. We've been using silver collagen however I changed him to and alginate today 01/06/2018; some improvement in wound area especially on the right lateral foot. The wounds are 3 small areas that have contracted. The area on the left medial malleolus also looks smaller 01/30/2018; he has areas on the right lateral foot and left medial ankle. Both of these look better than the last time. We only have one area on the right however it still has some depth. The area on the left medial ankle is superficial and appears to have healthy tissue. We have been using silver  alginate READMISSION 04/08/2023 This is a patient we had in the clinic in 2019 on 2 separate occasions. I  think when he was here last I looked after him. He had wounds on the right lateral foot and left medial ankle. We simply cleaned out the wounds dressed him and put him in compression and these healed on both occasions fairly quickly. He says that nothing really happened until about 2-1/2 months ago he had an opening on the right medial lower leg just above the ankle which was exceptionally painful. 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 He has been putting Silvadene cream on this. He had a course of doxycycline neither 1 of these really helped. He finds this wound painful. VALDEZ, SHOTTS ([id number]) [id number] Page 3 of 11 Past medical history the patient is a prediabetic with a recent hemoglobin A1c of 6. He has longstanding Raynaud's phenomenon. At some points in his past he has been queried to have some form of connective tissue disease. I did not see any rheumatologic tests in epic although I did find a normal ESR and C- reactive protein from 2012. He is a non-smoker ABI in our clinic was 1 on the right and 1.14 on the left Electronic Signature(s) Signed: 04/08/2023 11:51:18 AM By: Baltazar Najjar MD Entered By: Baltazar Najjar on 04/08/2023 06:01:29 -------------------------------------------------------------------------------- Physical Exam Details Patient Name: Date of Service: [name] MES Vasquez. 04/08/2023 8:00 A M Medical Record Number: [id number] Patient Account Number: [id number] Date of Birth/Sex: Treating RN: 1980-08-09 (42 y.o. M) Primary Care Provider: Hyman Hopes Other Clinician: Referring Provider: Treating Provider/Extender: Arletha Grippe in Treatment: 0 Constitutional Sitting or standing Blood Pressure is within target range for patient.. Pulse regular and within target range for patient.Marland Kitchen Respirations regular, non-labored and within target range..  Temperature is normal and within the target range for the patient.Marland Kitchen Appears in no distress. Respiratory work of breathing is normal. Cardiovascular Pedal pulses are palpable in the right foot. Musculoskeletal The patient has synovial synovial thickening at the PIP and MCP joints of both hands in a symmetric fashion. He cannot fully extend his fingers. There is no evidence of active synovitis in any of these joints or the small joints of his feet. Notes Wound exam; small punched-out wound on the right medial lower leg just above the ankle. Some debris on the surface washed out. Eschar and thick skin around the wound was removed with a #3 curette no bleeding. I think he has edema in both legs that is nonpitting. Skin discoloration in the medial part of both ankles probably stasis dermatitis. I also note he does not have here in the distal one half of his legs Electronic Signature(s) Signed: 04/08/2023 11:51:18 AM By: Baltazar Najjar MD Entered By: Baltazar Najjar on 04/08/2023 06:04:09 -------------------------------------------------------------------------------- Physician Orders Details Patient Name: Date of Service: [name] MES Vasquez. 04/08/2023 8:00 A M Medical Record Number: [id number] Patient Account Number: [id number] Date of Birth/Sex: Treating RN: 1980-05-21 (42 y.o. Tammy Sours Primary Care Provider: Hyman Hopes Other Clinician: Referring Provider: Treating Provider/Extender: Arletha Grippe in Treatment: 0 The following information was scribed by: Shawn Stall The information was scribed for: Baltazar Najjar Verbal / Phone Orders: No Diagnosis Coding Follow-up Appointments ppointment in 2 weeks. - ***Dr. Mikey Bussing**** (front office to schedule) Return A HUSNAIN, ZAHNOW ([id number]) [id number] Page 4 of 11 Nurse Visit: -  0845 Tuesday 04/15/2023 nurse visit (front office to schedule) Other: - [facility] will call you  to schedule the tests. The morning of the test remove the compression wrap, cover wound the bandage, and go to have the tests performed. Anesthetic (In clinic) Topical Lidocaine 4% applied to wound bed Bathing/ Shower/ Hygiene May shower with protection but do not get wound dressing(s) wet. Protect dressing(s) with water repellant cover (for example, large plastic bag) or a cast cover and may then take shower. Edema Control - Orders / Instructions Elevate legs to the level of the heart or above for 30 minutes daily and/or when sitting for 3-4 times a day throughout the day. Avoid standing for long periods of time. Patient to wear own compression stockings every day. - to left leg apply in the morning and remove at night. purchase compression stockings tightness 20-30 mm/Hg pressure. Exercise regularly Moisturize legs daily. - left leg Wound Treatment Wound #4 - Lower Leg Wound Laterality: Right, Medial Cleanser: Soap and Water 1 x Per Week/30 Days Discharge Instructions: May shower and wash wound with dial antibacterial soap and water prior to dressing change. Cleanser: Vashe 5.8 (oz) 1 x Per Week/30 Days Discharge Instructions: Cleanse the wound with Vashe prior to applying a clean dressing using gauze sponges, not tissue or cotton balls. Peri-Wound Care: Triamcinolone 15 (g) 1 x Per Week/30 Days Discharge Instructions: ***Use triamcinolone liberally**** Peri-Wound Care: Sween Lotion (Moisturizing lotion) 1 x Per Week/30 Days Discharge Instructions: Apply moisturizing lotion as directed Prim Dressing: PolyMem Silver Non-Adhesive Dressing, 4.25x4.25 in 1 x Per Week/30 Days ary Discharge Instructions: Apply to wound bed as instructed Secondary Dressing: ABD Pad, 5x9 1 x Per Week/30 Days Discharge Instructions: Apply over primary dressing as directed. Compression Wrap: Urgo K2, (equivalent to a 4 layer) two layer compression system, regular 1 x Per Week/30 Days Discharge Instructions:  Apply Urgo K2 as directed (alternative to 4 layer compression). Services and Therapies Venous Studies -Bilateral - [facility] to perform venous studies to bilateral lower legs due to wound to right leg and edema to both legs. rterial Studies- Bilateral with A and TBIs - [facility] to perform arterial studies with ABIs and TBIs A bIs [facility] rheumatology - Consult to Rheumatology due to pain small joints of hands. Patient Medications llergies: Shellfish Containing Products, pork extract A Notifications Medication Indication Start End applied only in clinic for12/24/2024 lidocaine debridements. DOSE 1 - topical 4 % cream - 1 cream topical Electronic Signature(s) Signed: 04/08/2023 11:51:18 AM By: Baltazar Najjar MD Signed: 04/08/2023 12:25:14 PM By: Shawn Stall RN, BSN Entered By: Shawn Stall on 04/08/2023 05:49:23 Prescription 04/08/2023 [name] ([id number]) [id number] Page 5 of 11 -------------------------------------------------------------------------------- Ernestene Mention MD Patient Name: Provider: 23-Sep-1980 [id number] Date of Birth: NPI#: Judie Petit [id number] Sex: DEA #: [phone] Phone #: License #: [id number] UPN: Patient Address: [address] Eligha Bridegroom [facility] W[facility] [place], [place] [address] [address] [place], [place] [address] [phone] Allergies Shellfish Containing Products; pork extract Provider's Orders rheumatology - Consult to Rheumatology due to pain small joints of hands. Hand Signature: Date(s): Electronic Signature(s) Signed: 04/08/2023 11:51:18 AM By: Baltazar Najjar MD Signed: 04/08/2023 12:25:14 PM By: Shawn Stall RN, BSN Entered By: Shawn Stall on 04/08/2023 05:49:24 -------------------------------------------------------------------------------- Problem List Details Patient Name: Date of Service: [name] MES Vasquez.  04/08/2023 8:00 A M Medical Record Number: [id number] Patient Account Number: [id number] Date of Birth/Sex:  Treating RN: 30-Jul-1980 (42 y.o. 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 04/08/2023 No Yes lower extremity L97.818 Non-pressure chronic ulcer of other part of right lower leg with other specified 04/08/2023 No Yes severity I73.00 Raynaud's syndrome without gangrene 04/08/2023 No Yes Inactive Problems Resolved Problems Electronic Signature(s) Signed: 04/08/2023 11:51:18 AM By: Baltazar Najjar MD Entered By: Baltazar Najjar on 04/08/2023 06:10:53 [name] ([id number]) [id number] Page 6 of 11 -------------------------------------------------------------------------------- Progress Note Details Patient Name: Date of Service: [name] MES Vasquez. 04/08/2023 8:00 A M Medical Record Number: [id number] Patient Account Number: [id number] Date of Birth/Sex: Treating RN: 17-Apr-1980 (42 y.o. M) Primary Care Provider: Hyman Hopes Other Clinician: Referring Provider: Treating Provider/Extender: Arletha Grippe in Treatment: 0 Subjective Chief Complaint Information obtained from Patient Left lateral foot ulcer 12/08/17; patient is here for review of a wound on his right lateral foot and left medial malleolus 04/08/2023; patient returns to our clinic with a painful wound on the right medial ankle History of Present Illness (HPI) 08/06/17 on evaluation today patient presents for a left lateral foot ulcer which he states began as what sounds to be an abscess. He was seen in the ER in March but states this actually first showed up in February. He showed it to a friend  and they felt that it might be a "spider bite". He eventually did go to the ER and that encounter was on 07/24/17. The wound that time was much larger than what it appears to be at this point based on the picture that I did review today in his chart. Nonetheless he was placed on antibiotics although I'm not sure what these antibiotics were as I cannot find them in the computer system at this point. He does have significant discomfort unfortunately. Patient has a history of rheumatoid arthritis with connective tissue disease but otherwise no significant history he is not a current smoker although he was formally he also no longer uses marijuana which he had in the past. He quit in 2012. He has been basically putting Vaseline on the area but nothing else has been done recently. He obviously was covering this with the bandage. 08/13/17 on evaluation today patient appears to be doing very good in regard to his left lateral lower extremity/foot ulcer. He has been tolerating the dressing changes without complication and overall this appears to be smaller today. He does still continue to have significant pain he continues to work full duty he does work in [employer]. 08/20/17 on evaluation today patient appears to be doing very well in regard to his left lateral foot ulcer. He has been tolerating the dressing changes without complication the Prisma does seem to be helping. With that being said he does still continue to have pain mainly in the posterior wound the more anterior wound is not really hurting him. Fortunately there is no evidence of infection showing up at this point. READMISSION 12/08/17 Is a patient that we had in clinic for a few visits in May of this year. 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 He tells me he was seen by her rheumatologist while  he was incarcerated  at [facility] several years ago we made this diagnosis. I don't have any of this information available. By talking to the patient he does clearly have Raynaud's phenomenon very cold sensitive he has had wounds in his hands and noted discoloration. On this occasion he developed a wound on his right lateral foot in June this started as a purplish discoloration and then expanded. It is very painful he is been using peroxide and a bandage. He developed another wound on the left medial malleolus 2-3 weeks ago. He is in the ER on July 23 and was given a prescription for doxycycline. He may have received additional antibiotics from his primary doctor nobody is really sure about this. He has a history of gout, Raynaud's and connective tissue diseaseo Overlap syndrome. He has an ABI on the right of 1.09 and on the left at 1.18. He works in a [employer] essentially works 7 days a week 12/22/17; his wounds measures smaller but not much change in overall appearance. On the right lateral foot he has 2 open areas. The more proximal one has depth and perhaps a small superficial tendon at the base. The distal one has no depth and the surface looks quite good. Left lateral malleolus measuring smaller. We've been using silver collagen 12/29/17; not much improvement. He has 3 open areas on the right lateral foot with close proximity to each other. The more proximal one as some depth the others are superficial. The area over the left medial ankle/medial malleolus has some fixed skin around the circumference. We've been using silver collagen however I changed him to and alginate today 01/06/2018; some improvement in wound area especially on the right lateral foot. The wounds are 3 small areas that have contracted. The area on the left medial malleolus also looks smaller 01/30/2018; he has areas on the right lateral foot and left medial ankle. Both of these look better than the last time. We only have one area  on the right however it still has some depth. The area on the left medial ankle is superficial and appears to have healthy tissue. We have been using silver alginate READMISSION 04/08/2023 This is a patient we had in the clinic in 2019 on 2 separate occasions. I think when he was here last I looked after him. He had wounds on the right lateral foot and left medial ankle. We simply cleaned out the wounds dressed him and put him in compression and these healed on both occasions fairly quickly. He says that nothing really happened until about 2-1/2 months ago he had an opening on the right medial lower leg just above the ankle which was exceptionally painful. 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 He has been putting Silvadene cream on this. He had a course of doxycycline neither 1 of these really helped. He finds this wound painful. Past medical history the patient is a prediabetic with a recent hemoglobin A1c of 6. He has longstanding Raynaud's phenomenon. At some points in his past he has been queried to have some form of connective tissue disease. I did not see any rheumatologic tests in epic although I did find a normal ESR and C- reactive protein from 2012. He is a non-smoker ABI in our clinic was 1 on the right and 1.14 on the left Patient History Information obtained from Patient. Allergies Shellfish Containing Products, pork extract CATARINO, TROWELL Vasquez ([id number]) [id number] Page 7 of  11 Family History Cancer - Maternal Grandparents, Diabetes - Father, Heart Disease - Siblings, No family history of Hereditary Spherocytosis, Hypertension, Kidney Disease, Lung Disease, Seizures, Stroke, Thyroid Problems, Tuberculosis. Social History Former smoker - quit 2005, Marital Status - Single, Alcohol Use - Never, Drug Use - No History, Caffeine Use - Rarely. Medical History Eyes Denies history of Cataracts,  Glaucoma, Optic Neuritis Ear/Nose/Mouth/Throat Denies history of Chronic sinus problems/congestion, Middle ear problems Hematologic/Lymphatic Denies history of Anemia, Hemophilia, Human Immunodeficiency Virus, Lymphedema, Sickle Cell Disease Respiratory Denies history of Aspiration, Asthma, Chronic Obstructive Pulmonary Disease (COPD), Pneumothorax, Sleep Apnea, Tuberculosis Cardiovascular Denies history of Angina, Arrhythmia, Congestive Heart Failure, Coronary Artery Disease, Deep Vein Thrombosis, Hypertension, Hypotension, Myocardial Infarction, Peripheral Arterial Disease, Peripheral Venous Disease, Phlebitis, Vasculitis Gastrointestinal Denies history of Cirrhosis , Colitis, Crohns, Hepatitis A, Hepatitis B, Hepatitis C Endocrine Denies history of Type I Diabetes, Type II Diabetes Genitourinary Denies history of End Stage Renal Disease Immunological Patient has history of Raynauds Denies history of Lupus Erythematosus, Scleroderma Integumentary (Skin) Denies history of History of Burn Musculoskeletal Patient has history of Gout Denies history of Rheumatoid Arthritis, Osteoarthritis, Osteomyelitis Neurologic Denies history of Dementia, Neuropathy, Quadriplegia, Paraplegia, Seizure Disorder Oncologic Denies history of Received Chemotherapy, Received Radiation Psychiatric Denies history of Anorexia/bulimia, Confinement Anxiety Medical A Surgical History Notes nd Genitourinary NGU (nongonococcal urethritis) Musculoskeletal Connective tissue disease Objective Constitutional Sitting or standing Blood Pressure is within target range for patient.. Pulse regular and within target range for patient.Marland Kitchen Respirations regular, non-labored and within target range.. Temperature is normal and within the target range for the patient.Marland Kitchen Appears in no distress. Vitals Time Taken: 8:14 AM, Height: 75 in, Source: Stated, Weight: 290 lbs, Source: Stated, BMI: 36.2, Temperature: 98 F, Pulse: 92  bpm, Respiratory Rate: 20 breaths/min, Blood Pressure: 125/83 mmHg. Respiratory work of breathing is normal. Cardiovascular Pedal pulses are palpable in the right foot. Musculoskeletal The patient has synovial synovial thickening at the PIP and MCP joints of both hands in a symmetric fashion. He cannot fully extend his fingers. There is no evidence of active synovitis in any of these joints or the small joints of his feet. General Notes: Wound exam; small punched-out wound on the right medial lower leg just above the ankle. Some debris on the surface washed out. Eschar and thick skin around the wound was removed with a #3 curette no bleeding. I think he has edema in both legs that is nonpitting. Skin discoloration in the medial part of both ankles probably stasis dermatitis. I also note he does not have here in the distal one half of his legs Integumentary (Hair, Skin) Wound #4 status is Open. Original cause of wound was Not Known. The date acquired was: 01/29/2023. The wound is located on the Right,Medial Lower Leg. The wound measures 2cm length x 1cm width x 0.1cm depth; 1.571cm^2 area and 0.157cm^3 volume. There is Fat Layer (Subcutaneous Tissue) exposed. There is no tunneling or undermining noted. There is a medium amount of serosanguineous drainage noted. The wound margin is distinct with the outline attached to the wound base. There is small (1-33%) pink granulation within the wound bed. There is a large (67-100%) amount of necrotic tissue within the wound bed including Adherent Slough. The periwound skin appearance exhibited: Hemosiderin Staining. The periwound skin appearance did not exhibit: Callus, Crepitus, Excoriation, Induration, Rash, Scarring, Dry/Scaly, Maceration, Atrophie Blanche, Cyanosis, Ecchymosis, Mottled, Pallor, Rubor, Erythema. Periwound temperature was noted as No Abnormality. FAIZ, SHIM ([id number]) [id number] Page 8 of  11 Assessment Active Problems ICD-10 Chronic venous hypertension (idiopathic) with ulcer and inflammation of right lower extremity Non-pressure chronic ulcer of other part of right lower leg with other specified severity Raynaud's syndrome without gangrene Procedures Wound #4 Pre-procedure diagnosis of Wound #4 is a Venous Leg Ulcer located on the Right,Medial Lower Leg .Severity of Tissue Pre Debridement is: Fat layer exposed. There was a Selective/Open Wound Skin/Epidermis Debridement with a total area of 1.57 sq cm performed by Maxwell Caul., MD. With the following instrument(s): Curette to remove Viable and Non-Viable tissue/material. Material removed includes Slough, Skin: Dermis, and Skin: Epidermis after achieving pain control using Lidocaine 4% Topical Solution. A time out was conducted at 08:30, prior to the start of the procedure. A Minimum amount of bleeding was controlled with Pressure. The procedure was tolerated well with a pain level of 0 throughout and a pain level of 0 following the procedure. Post Debridement Measurements: 2cm length x 1cm width x 0.1cm depth; 0.157cm^3 volume. Character of Wound/Ulcer Post Debridement is improved. Severity of Tissue Post Debridement is: Fat layer exposed. Post procedure Diagnosis Wound #4: Same as Pre-Procedure Pre-procedure diagnosis of Wound #4 is a Venous Leg Ulcer located on the Right,Medial Lower Leg . There was a Double Layer Compression Therapy Procedure by Shawn Stall, RN. Post procedure Diagnosis Wound #4: Same as Pre-Procedure Plan Follow-up Appointments: Return Appointment in 2 weeks. - ***Dr. Mikey Bussing**** (front office to schedule) Nurse Visit: - 0845 Tuesday 04/15/2023 nurse visit (front office to schedule) Other: - [facility] will call you to schedule the tests. The morning of the test remove the compression wrap, cover wound the bandage, and go to have the tests performed. Anesthetic: (In clinic) Topical  Lidocaine 4% applied to wound bed Bathing/ Shower/ Hygiene: May shower with protection but do not get wound dressing(s) wet. Protect dressing(s) with water repellant cover (for example, large plastic bag) or a cast cover and may then take shower. Edema Control - Orders / Instructions: Elevate legs to the level of the heart or above for 30 minutes daily and/or when sitting for 3-4 times a day throughout the day. Avoid standing for long periods of time. Patient to wear own compression stockings every day. - to left leg apply in the morning and remove at night. purchase compression stockings tightness 20-30 mm/Hg pressure. Exercise regularly Moisturize legs daily. - left leg Services and Therapies ordered were: Venous Studies -Bilateral - [facility] to perform venous studies to bilateral lower legs due to wound to right leg and edema to both legs., Arterial Studies- Bilateral with AbIs and TBIs - [facility] to perform arterial studies with ABIs and TBIs ordered were: rheumatology - Consult to Rheumatology due to pain small joints of hands. The following medication(s) was prescribed: lidocaine topical 4 % cream 1 1 cream topical for applied only in clinic for debridements. was prescribed at facility WOUND #4: - Lower Leg Wound Laterality: Right, Medial Cleanser: Soap and Water 1 x Per Week/30 Days Discharge Instructions: May shower and wash wound with dial antibacterial soap and water prior to dressing change. Cleanser: Vashe 5.8 (oz) 1 x Per Week/30 Days Discharge Instructions: Cleanse the wound with Vashe prior to applying a clean dressing using gauze sponges, not tissue or cotton balls. Peri-Wound Care: Triamcinolone 15 (g) 1 x Per Week/30 Days Discharge Instructions: ***Use triamcinolone liberally**** Peri-Wound Care: Sween Lotion (Moisturizing lotion) 1 x Per Week/30 Days Discharge Instructions: Apply moisturizing lotion as directed Prim Dressing: PolyMem Silver Non-Adhesive  Dressing, 4.25x4.25 in 1 x Per Week/30 Days ary Discharge Instructions: Apply to wound bed as instructed Secondary Dressing: ABD Pad, 5x9 1 x Per Week/30 Days Discharge Instructions: Apply over primary dressing as directed. Com pression Wrap: Urgo K2, (equivalent to a 4 layer) two layer compression system, regular 1 x Per Week/30 Days Discharge Instructions: Apply Urgo K2 as directed (alternative to 4 layer compression). 1. I suspect this patient has chronic venous disease with stasis dermatitis and that is a major etiology of these recurrent issue. Will use polymen silver as the primary dressing with liberal steroid around the small wound. ABD under full Urgo K2 compression 2. The last time he was in clinic we debrided the wounds dressed the wounds put him in compression and this healed. He has not been wearing stockings 3. On this occasion I thought we should go ahead with formal venous and arterial studies [hair loss in his distal one half of both legs]. These were ordered. 4. Is also difficult not to look at this patient's hands and wonder about a rheumatologic disease. When I first looked at this I felt fairly certain this would be rheumatoid arthritis but I think he could be screened for lupus and scleroderma. T owards this end we will try to refer him to rheumatology. He did have x-rays KAZUTO, MARINEZ Vasquez ([id number]) [id number] Page 9 of 11 of the left hand in July which were essentially negative also the right wrist which was negative and since December. Of course the presence of any of these rheumatologic illnesses would raise the possibility of vasculopathy, vasculitis especially if vascular evaluation is negative Electronic Signature(s) Signed: 04/10/2023 3:30:05 PM By: Shawn Stall RN, BSN Signed: 04/10/2023 4:36:22 PM By: Baltazar Najjar MD Previous Signature: 04/08/2023 11:51:18 AM Version By: Baltazar Najjar MD Entered By: Shawn Stall on 04/10/2023  11:35:46 -------------------------------------------------------------------------------- HxROS Details Patient Name: Date of Service: [name] MES Vasquez. 04/08/2023 8:00 A M Medical Record Number: [id number] Patient Account Number: [id number] Date of Birth/Sex: Treating RN: 01-30-1981 (42 y.o. Tammy Sours Primary Care Provider: Hyman Hopes Other Clinician: Referring Provider: Treating Provider/Extender: Arletha Grippe in Treatment: 0 Information Obtained From Patient Eyes Medical History: Negative for: Cataracts; Glaucoma; Optic Neuritis Ear/Nose/Mouth/Throat Medical History: Negative for: Chronic sinus problems/congestion; Middle ear problems Hematologic/Lymphatic Medical History: Negative for: Anemia; Hemophilia; Human Immunodeficiency Virus; Lymphedema; Sickle Cell Disease Respiratory Medical History: Negative for: Aspiration; Asthma; Chronic Obstructive Pulmonary Disease (COPD); Pneumothorax; Sleep Apnea; Tuberculosis Cardiovascular Medical History: Negative for: Angina; Arrhythmia; Congestive Heart Failure; Coronary Artery Disease; Deep Vein Thrombosis; Hypertension; Hypotension; Myocardial Infarction; Peripheral Arterial Disease; Peripheral Venous Disease; Phlebitis; Vasculitis Gastrointestinal Medical History: Negative for: Cirrhosis ; Colitis; Crohns; Hepatitis A; Hepatitis B; Hepatitis C Endocrine Medical History: Negative for: Type I Diabetes; Type II Diabetes Genitourinary Medical History: Negative for: End Stage Renal Disease Past Medical History Notes: NGU (nongonococcal urethritis) Immunological Medical History: Positive for: RYYAN, AREDONDO ([id number]) [phone].pdf Page 10 of 11 Negative for: Lupus Erythematosus; Scleroderma Integumentary (Skin) Medical History: Negative for: History of Burn Musculoskeletal Medical History: Positive for: Gout Negative for: Rheumatoid Arthritis;  Osteoarthritis; Osteomyelitis Past Medical History Notes: Connective tissue disease Neurologic Medical History: Negative for: Dementia; Neuropathy; Quadriplegia; Paraplegia; Seizure Disorder Oncologic Medical History: Negative for: Received Chemotherapy; Received Radiation Psychiatric Medical History: Negative for: Anorexia/bulimia; Confinement Anxiety Immunizations Pneumococcal Vaccine: Received Pneumococcal Vaccination: No Implantable Devices No devices added Family and Social History Cancer: Yes - Maternal Grandparents; Diabetes: Yes - Father; Heart Disease: Yes -  Siblings; Hereditary Spherocytosis: No; Hypertension: No; Kidney Disease: No; Lung Disease: No; Seizures: No; Stroke: No; Thyroid Problems: No; Tuberculosis: No; Former smoker - quit 2005; Marital Status - Single; Alcohol Use: Never; Drug Use: No History; Caffeine Use: Rarely Social Determinants of Health (SDOH) 1. In the past 2 months, did you or others you live with eat smaller meals or skip meals because you didn't have money for foodo : No 2. Are you homeless or worried that you might be in the futureo : No 3. Do you have trouble paying for your utilities (gas, electricity, phone)o : No 4. Do you have trouble finding or paying for a rideo : No 5. Do you need daycare, or better daycare, for your kidso : No 6. Are you unemployed or without regular incomeo : No 7. Do you need help finding a better jobo : No 8. Do you need help getting more educationo : No 9. Are you concerned about someone in your home using drugs or alcoholo : No 10. Do you feel unsafe in your daily lifeo : No 11. Is anyone in your home threatening or abusing youo : No 12. Do you lack quality relationships that make you feel valued and supportedo : No 13. Do you need help getting cultural information in a language you understando : No 14. Do you need help getting internet accesso : No Advanced Directives and Instructions Advanced Directives:  No Patient wants information on Advanced Directives: No Do not resuscitate: No Living Will: No Medical Power of Attorney: No Surrogate Decision Maker: No Electronic Signature(s) Signed: 04/08/2023 11:51:18 AM By: Baltazar Najjar MD Signed: 04/08/2023 12:25:14 PM By: Shawn Stall RN, BSN Previous Signature: 04/08/2023 7:48:39 AM Version By: Shawn Stall RN, BSN Entered By: Shawn Stall on 04/08/2023 05:15:37 HAYDYN, AUBE ([id number]) [id number] Page 11 of 11 -------------------------------------------------------------------------------- SuperBill Details Patient Name: Date of Service: [name] MES Vasquez. 04/08/2023 Medical Record Number: [id number] Patient Account Number: [id number] Date of Birth/Sex: Treating RN: [birth year] (42 y.o. M) Primary Care Provider: Hyman Hopes Other Clinician: Referring Provider: Treating Provider/Extender: Arletha Grippe in Treatment: 0 Diagnosis Coding ICD-10 Codes Code Description [phone] Chronic venous hypertension (idiopathic) with ulcer and inflammation of right lower extremity L97.818 Non-pressure chronic ulcer of other part of right lower leg with other specified severity I73.00 Raynaud's syndrome without gangrene Facility Procedures : CPT4 Code: 69629528 Description: 99213 - WOUND CARE VISIT-LEV 3 EST PT Modifier: Quantity: 1 : CPT4 Code: 41324401 Description: 97597 - DEBRIDE WOUND 1ST 20 SQ CM OR < ICD-10 Diagnosis Description L97.818 Non-pressure chronic ulcer of other part of right lower leg with other specified s Modifier: everity Quantity: 1 Physician Procedures : CPT4 Code Description Modifier [id number] - WC PHYS LEVEL 4 - NEW PT 25 ICD-10 Diagnosis Description I87.331 Chronic venous hypertension (idiopathic) with ulcer and inflammation of right lower extremity L97.818 Non-pressure chronic ulcer of other  part of right lower leg with other specified severity I73.00 Raynaud's  syndrome without gangrene Quantity: 1 : [id number] 97597 - WC PHYS DEBR WO ANESTH 20 SQ CM ICD-10 Diagnosis Description L97.818 Non-pressure chronic ulcer of other part of right lower leg with other specified severity Quantity: 1 Electronic Signature(s) Signed: 04/15/2023 11:02:20 AM By: Pearletha Alfred Signed: 04/15/2023 12:21:03 PM By: Baltazar Najjar MD Previous Signature: 04/15/2023 11:00:53 AM Version By: Pearletha Alfred Previous Signature: 04/08/2023 11:51:18 AM Version By: Baltazar Najjar MD Entered By: Pearletha Alfred on 04/15/2023 08:02:20

## 2023-04-08 NOTE — Progress Notes (Addendum)
LAFE, MATIAS ([id number]) [id number] Page 1 of 11 Visit Report for 04/08/2023 Allergy List Details Patient Name: Date of Service: [name], [name] 04/08/2023 8:00 A M Medical Record Number: [id number] Patient Account Number: [id number] Date of Birth/Sex: Treating RN: 19-Jan-1981 (42 y.o. [name] Primary Care [name]: [name] Other Clinician: Referring [name]: Treating [name]/Extender: [name] in Treatment: 0 Allergies Active Allergies Shellfish Containing Products pork extract Allergy Notes Electronic Signature(s) Signed: 04/08/2023 7:48:39 AM By: Robert Stall RN, BSN Entered By: [name] on 04/07/2023 08:04:12 -------------------------------------------------------------------------------- Arrival Information Details Patient Name: Date of Service: Robert Newer MES R. 04/08/2023 8:00 A M Medical Record Number: [id number] Patient Account Number: [id number] Date of Birth/Sex: Treating RN: 1980/06/23 (42 y.o. [name], [name] Primary Care Derrisha Foos: [name] Other Clinician: Referring Virl Coble: Treating [name]/Extender: [name] in Treatment: 0 Visit Information Patient Arrived: Ambulatory Arrival Time: 08:10 Accompanied By: self Transfer Assistance: None Patient Identification Verified: Yes Secondary Verification Process Completed: Yes Patient Requires Transmission-Based Precautions: No Patient Has Alerts: No History Since Last Visit Added or deleted any medications: No Any new allergies or adverse reactions: No Had a fall or experienced change in activities of daily living that may affect risk of falls: No Signs or symptoms of abuse/neglect since last visito No Hospitalized since last visit: No Implantable device outside of the clinic excluding cellular tissue based products placed in the center since last visit: No Has Dressing in Place as Prescribed:  Yes Electronic Signature(s) Signed: 04/08/2023 12:25:14 PM By: Robert Stall RN, BSN Entered By: [name] on 04/08/2023 05:11:21 [name] ([id number]) [id number] Page 2 of 11 -------------------------------------------------------------------------------- Clinic Level of Care Assessment Details Patient Name: Date of Service: Robert Newer MES R. 04/08/2023 8:00 A M Medical Record Number: [id number] Patient Account Number: [id number] Date of Birth/Sex: Treating RN: [birth year] (42 y.o. [name] Primary Care [name]: [name] Other Clinician: Referring Travaris Kosh: Treating [name]/Extender: [name] in Treatment: 0 Clinic Level of Care Assessment Items TOOL 1 Quantity Score X- 1 0 Use when EandM and Procedure is performed on INITIAL visit ASSESSMENTS - Nursing Assessment / Reassessment X- 1 20 General Physical Exam (combine w/ comprehensive assessment (listed just below) when performed on new pt. evals) X- 1 25 Comprehensive Assessment (HX, ROS, Risk Assessments, Wounds Hx, etc.) ASSESSMENTS - Wound and Skin Assessment / Reassessment X- 1 10 Dermatologic / Skin Assessment (not related to wound area) ASSESSMENTS - Ostomy and/or Continence Assessment and Care []  - 0 Incontinence Assessment and Management []  - 0 Ostomy Care Assessment and Management (repouching, etc.) PROCESS - Coordination of Care X - Simple Patient / Family Education for ongoing care 1 15 []  - 0 Complex (extensive) Patient / Family Education for ongoing care X- 1 10 Staff obtains Chiropractor, Records, T Results / Process Orders est []  - 0 Staff telephones HHA, Nursing Homes / Clarify orders / etc []  - 0 Routine Transfer to another Facility (non-emergent condition) []  - 0 Routine Hospital Admission (non-emergent condition) X- 1 15 New Admissions / Manufacturing engineer / Ordering NPWT Apligraf, etc. , []  - 0 Emergency Hospital  Admission (emergent condition) PROCESS - Special Needs []  - 0 Pediatric / Minor Patient Management []  - 0 Isolation Patient Management []  - 0 Hearing / Language / Visual special needs []  - 0 Assessment of Community assistance (transportation, D/C planning, etc.) []  - 0 Additional assistance / Altered mentation []  - 0 Support Surface(s) Assessment (  bed, cushion, seat, etc.) INTERVENTIONS - Miscellaneous []  - 0 External ear exam []  - 0 Patient Transfer (multiple staff / Nurse, adult / Similar devices) []  - 0 Simple Staple / Suture removal (25 or less) []  - 0 Complex Staple / Suture removal (26 or more) []  - 0 Hypo/Hyperglycemic Management (do not check if billed separately) X- 1 15 Ankle / Brachial Index (ABI) - do not check if billed separately Has the patient been seen at the hospital within the last three years: Yes Total Score: 110 Level Of Care: New/Established - Level 3 Electronic Signature(s) Signed: 04/08/2023 12:25:14 PM By: Robert Stall RN, BSN [place], [place] R ([id number]) [id number] Page 3 of 11 Signed: 04/08/2023 12:25:14 PM By: Robert Stall RN, BSN Entered By: [name] on 04/08/2023 05:43:33 -------------------------------------------------------------------------------- Compression Therapy Details Patient Name: Date of Service: Robert Newer MES R. 04/08/2023 8:00 A M Medical Record Number: [id number] Patient Account Number: [id number] Date of Birth/Sex: Treating RN: 1980-11-13 (42 y.o. [name] Primary Care Shray Hunley: [name] Other Clinician: Referring Akeisha Lagerquist: Treating Robert Tobin/Extender: [name] in Treatment: 0 Compression Therapy Performed for Wound Assessment: Wound #4 Right,Medial Lower Leg Performed By: Clinician Robert Stall, RN Compression Type: Double Layer Post Procedure Diagnosis Same as Pre-procedure Electronic Signature(s) Signed: 04/08/2023 12:25:14 PM By: Robert Stall  RN, BSN Entered By: [name] on 04/08/2023 05:41:11 -------------------------------------------------------------------------------- Encounter Discharge Information Details Patient Name: Date of Service: Robert Newer MES R. 04/08/2023 8:00 A M Medical Record Number: [id number] Patient Account Number: [id number] Date of Birth/Sex: Treating RN: [birth year] (42 y.o. [name] Primary Care Nashya Garlington: [name] Other Clinician: Referring Daequan Kozma: Treating Kyran Whittier/Extender: [name] in Treatment: 0 Encounter Discharge Information Items Post Procedure Vitals Discharge Condition: Stable Temperature (F): 98 Ambulatory Status: Ambulatory Pulse (bpm): 92 Discharge Destination: Home Respiratory Rate (breaths/min): 20 Transportation: Private Auto Blood Pressure (mmHg): 125/83 Accompanied By: self Schedule Follow-up Appointment: Yes Clinical Summary of Care: Electronic Signature(s) Signed: 04/08/2023 12:25:14 PM By: Robert Stall RN, BSN Entered By: [name] on 04/08/2023 05:44:11 -------------------------------------------------------------------------------- Lower Extremity Assessment Details Patient Name: Date of Service: GO Ennis Forts MES R. 04/08/2023 8:00 A M Medical Record Number: [id number] Patient Account Number: [id number] Date of Birth/Sex: Treating RN: 05-28-1980 (42 y.o. [name] Primary Care Yancy Hascall: [name] Other Clinician: SAVIR, [name] ([id number]) [id number] Page 4 of 11 Referring Eban Weick: Treating Pavle Wiler/Extender: [name] in Treatment: 0 Edema Assessment Assessed: [Left: Yes] [Right: Yes] Edema: [Left: Yes] [Right: Yes] Calf Left: Right: Point of Measurement: 35 cm From Medial Instep 46.5 cm 47 cm Ankle Left: Right: Point of Measurement: 12 cm From Medial Instep 29 cm 28 cm Knee To Floor Left: Right: From Medial Instep 53 cm Vascular  Assessment Pulses: Dorsalis Pedis Palpable: [Left:Yes] [Right:Yes] Doppler Audible: [Left:Yes] [Right:Yes] Posterior Tibial Palpable: [Left:Yes] [Right:Yes] Doppler Audible: [Left:Yes] [Right:Yes] Extremity colors, hair growth, and conditions: Extremity Color: [Left:Normal] [Right:Normal] Hair Growth on Extremity: [Left:Yes] [Right:Yes] Temperature of Extremity: [Left:Warm] [Right:Warm] Capillary Refill: [Left:< 3 seconds] [Right:< 3 seconds] Dependent Rubor: [Left:No] [Right:No] Blanched when Elevated: [Left:No] [Right:No] Lipodermatosclerosis: [Left:No] [Right:No] Blood Pressure: Brachial: [Left:125] [Right:125] Ankle: [Left:Dorsalis Pedis: 130 1.04] [Right:Dorsalis Pedis: 125 1.00] Toe Nail Assessment Left: Right: Thick: No No Discolored: No No Deformed: No No Improper Length and Hygiene: No No Electronic Signature(s) Signed: 04/08/2023 12:25:14 PM By: Robert Stall RN, BSN Entered By: [name] on 04/08/2023 05:21:47 -------------------------------------------------------------------------------- Multi Wound Chart Details Patient Name: Date of  Service: Robert Newer MES R. 04/08/2023 8:00 A M Medical Record Number: [id number] Patient Account Number: [id number] Date of Birth/Sex: Treating RN: 04/06/1981 (42 y.o. M) Primary Care Phat Dalton: [name] Other Clinician: Referring Cherice Glennie: Treating Chasta Deshpande/Extender: [name] in Treatment: 0 Vital Signs Height(in): 75 Pulse(bpm): 92 Weight(lbs): 290 Blood Pressure(mmHg): 125/83 [name], [name] ([id number]) [phone].pdf Page 5 of 11 Body Mass Index(BMI): 36.2 Temperature(F): 98 Respiratory Rate(breaths/min): 20 [4:Photos:] [N/A:N/A] Right, Medial Lower Leg N/A N/A Wound Location: Not Known N/A N/A Wounding Event: Venous Leg Ulcer N/A N/A Primary Etiology: Raynauds, Gout N/A N/A Comorbid History: 01/29/2023 N/A N/A Date Acquired: 0 N/A N/A Weeks of  Treatment: Open N/A N/A Wound Status: No N/A N/A Wound Recurrence: 2x1x0.1 N/A N/A Measurements L x W x D (cm) 1.571 N/A N/A A (cm) : rea 0.157 N/A N/A Volume (cm) : Full Thickness Without Exposed N/A N/A Classification: Support Structures Medium N/A N/A Exudate A mount: Serosanguineous N/A N/A Exudate Type: red, brown N/A N/A Exudate Color: Distinct, outline attached N/A N/A Wound Margin: Small (1-33%) N/A N/A Granulation A mount: Pink N/A N/A Granulation Quality: Large (67-100%) N/A N/A Necrotic A mount: Fat Layer (Subcutaneous Tissue): Yes N/A N/A Exposed Structures: Fascia: No Tendon: No Muscle: No Joint: No Bone: No None N/A N/A Epithelialization: Debridement - Selective/Open Wound N/A N/A Debridement: Pre-procedure Verification/Time Out 08:30 N/A N/A Taken: Lidocaine 4% Topical Solution N/A N/A Pain Control: Slough N/A N/A Tissue Debrided: Skin/Epidermis N/A N/A Level: 1.57 N/A N/A Debridement A (sq cm): rea Curette N/A N/A Instrument: Minimum N/A N/A Bleeding: Pressure N/A N/A Hemostasis A chieved: 0 N/A N/A Procedural Pain: 0 N/A N/A Post Procedural Pain: Procedure was tolerated well N/A N/A Debridement Treatment Response: 2x1x0.1 N/A N/A Post Debridement Measurements L x W x D (cm) 0.157 N/A N/A Post Debridement Volume: (cm) Excoriation: No N/A N/A Periwound Skin Texture: Induration: No Callus: No Crepitus: No Rash: No Scarring: No Maceration: No N/A N/A Periwound Skin Moisture: Dry/Scaly: No Hemosiderin Staining: Yes N/A N/A Periwound Skin Color: Atrophie Blanche: No Cyanosis: No Ecchymosis: No Erythema: No Mottled: No Pallor: No Rubor: No No Abnormality N/A N/A Temperature: Compression Therapy N/A N/A Procedures Performed: Debridement Treatment Notes Wound #4 (Lower Leg) Wound Laterality: Right, [address] YOSHIHARU, STAEBELL R ([id number]) [id number] Page 6 of 11 Soap and  Water Discharge Instruction: May shower and wash wound with dial antibacterial soap and water prior to dressing change. Vashe 5.8 (oz) Discharge Instruction: Cleanse the wound with Vashe prior to applying a clean dressing using gauze sponges, not tissue or cotton balls. Peri-Wound Care Triamcinolone 15 (g) Discharge Instruction: ***Use triamcinolone liberally**** Sween Lotion (Moisturizing lotion) Discharge Instruction: Apply moisturizing lotion as directed Topical Primary Dressing PolyMem Silver Non-Adhesive Dressing, 4.25x4.25 in Discharge Instruction: Apply to wound bed as instructed Secondary Dressing ABD Pad, 5x9 Discharge Instruction: Apply over primary dressing as directed. Secured With Compression Wrap Urgo K2, (equivalent to a 4 layer) two layer compression system, regular Discharge Instruction: Apply Urgo K2 as directed (alternative to 4 layer compression). Compression Stockings Add-Ons Electronic Signature(s) Signed: 04/08/2023 11:51:18 AM By: Baltazar Najjar MD Entered By: Baltazar Najjar on 04/08/2023 05:52:27 -------------------------------------------------------------------------------- Multi-Disciplinary Care Plan Details Patient Name: Date of Service: Robert Newer MES R. 04/08/2023 8:00 A M Medical Record Number: [id number] Patient Account Number: [id number] Date of Birth/Sex: Treating RN: Feb 13, 1981 (42 y.o. [name] Primary Care Kyreese Chio: [name] Other Clinician: Referring Gauge Winski: Treating Halayna Blane/Extender: [name] in Treatment: 0  Active Inactive Orientation to the Wound Care Program Nursing Diagnoses: Knowledge deficit related to the wound healing center program Goals: Patient/caregiver will verbalize understanding of the Wound Healing Center Program Date Initiated: 04/08/2023 Target Resolution Date: 04/17/2023 Goal Status: Active Interventions: Provide education on orientation to the wound center Notes: Pain,  Acute or Chronic Nursing Diagnoses: [name], [name] ([id number]) [phone].pdf Page 7 of 11 Pain, acute or chronic: actual or potential Potential alteration in comfort, pain Goals: Patient will verbalize adequate pain control and receive pain control interventions during procedures as needed Date Initiated: 04/08/2023 Target Resolution Date: 04/18/2023 Goal Status: Active Patient/caregiver will verbalize comfort level met Date Initiated: 04/08/2023 Target Resolution Date: 04/18/2023 Goal Status: Active Interventions: Encourage patient to take pain medications as prescribed Provide education on pain management Reposition patient for comfort Treatment Activities: Administer pain control measures as ordered : 04/08/2023 Notes: Wound/Skin Impairment Nursing Diagnoses: Knowledge deficit related to ulceration/compromised skin integrity Goals: Patient/caregiver will verbalize understanding of skin care regimen Date Initiated: 04/08/2023 Target Resolution Date: 04/18/2023 Goal Status: Active Interventions: Assess patient/caregiver ability to perform ulcer/skin care regimen upon admission and as needed Assess ulceration(s) every visit Provide education on ulcer and skin care Treatment Activities: Skin care regimen initiated : 04/08/2023 Topical wound management initiated : 04/08/2023 Notes: Electronic Signature(s) Signed: 04/08/2023 12:25:14 PM By: Robert Stall RN, BSN Entered By: [name] on 04/08/2023 05:28:13 -------------------------------------------------------------------------------- Pain Assessment Details Patient Name: Date of Service: Robert Newer MES R. 04/08/2023 8:00 A M Medical Record Number: [id number] Patient Account Number: [id number] Date of Birth/Sex: Treating RN: June 21, 1980 (42 y.o. [name] Primary Care Wyn Nettle: [name] Other Clinician: Referring Paz Fuentes: Treating Tavis Kring/Extender: [name] in  Treatment: 0 Active Problems Location of Pain Severity and Description of Pain Patient Has Paino Yes Site Locations Pain Location: [name], [name] ([id number]) [phone].pdf Page 8 of 11 Pain Location: Pain in Ulcers Rate the pain. Current Pain Level: 7 Character of Pain Describe the Pain: Heavy, Sharp, Shooting Pain Management and Medication Current Pain Management: Medication: No Cold Application: No Rest: No Massage: No Activity: No T.E.N.S.: No Heat Application: No Leg drop or elevation: No Is the Current Pain Management Adequate: Adequate How does your wound impact your activities of daily livingo Sleep: No Bathing: No Appetite: No Relationship With Others: No Bladder Continence: No Emotions: No Bowel Continence: No Work: No Toileting: No Drive: No Dressing: No Hobbies: No Notes pain 2 1/2 months per patient to right ankle. Electronic Signature(s) Signed: 04/08/2023 12:25:14 PM By: Robert Stall RN, BSN Entered By: [name] on 04/08/2023 05:13:52 -------------------------------------------------------------------------------- Patient/Caregiver Education Details Patient Name: Date of Service: Robert Newer MES R. 12/24/2024andnbsp8:00 A M Medical Record Number: [id number] Patient Account Number: [id number] Date of Birth/Gender: Treating RN: 16-Mar-1981 (42 y.o. [name] Primary Care Physician: [name] Other Clinician: Referring Physician: Treating Physician/Extender: [name] in Treatment: 0 Education Assessment Education Provided To: Patient Education Topics Provided Welcome T [facility]New Patient Packet: o Handouts: Welcome T [facility] o Methods: Explain/Verbal Responses: Reinforcements needed [name], [name] ([id number]) [phone].pdf Page 9 of 11 Electronic Signature(s) Signed: 04/08/2023 12:25:14 PM By: Robert Stall RN, BSN Entered  By: [name] on 04/08/2023 05:28:21 -------------------------------------------------------------------------------- Wound Assessment Details Patient Name: Date of Service: Robert Newer MES R. 04/08/2023 8:00 A M Medical Record Number: [id number] Patient Account Number: [id number] Date of Birth/Sex: Treating RN: 1981-03-06 (42 y.o. [name] Primary Care [name]:  [name] Other Clinician: Referring Nathanyal Ashmead: Treating [name]/Extender: [name] in Treatment: 0 Wound Status Wound Number: 4 Primary Etiology: Venous Leg Ulcer Wound Location: Right, Medial Lower Leg Wound Status: Open Wounding Event: Not Known Comorbid History: Raynauds, Gout Date Acquired: 01/29/2023 Weeks Of Treatment: 0 Clustered Wound: No Photos Wound Measurements Length: (cm) 2 Width: (cm) 1 Depth: (cm) 0.1 Area: (cm) 1.571 Volume: (cm) 0.157 % Reduction in Area: % Reduction in Volume: Epithelialization: None Tunneling: No Undermining: No Wound Description Classification: Full Thickness Without Exposed Suppor Wound Margin: Distinct, outline attached Exudate Amount: Medium Exudate Type: Serosanguineous Exudate Color: red, brown t Structures Foul Odor After Cleansing: No Slough/Fibrino Yes Wound Bed Granulation Amount: Small (1-33%) Exposed Structure Granulation Quality: Pink Fascia Exposed: No Necrotic Amount: Large (67-100%) Fat Layer (Subcutaneous Tissue) Exposed: Yes Necrotic Quality: Adherent Slough Tendon Exposed: No Muscle Exposed: No Joint Exposed: No Bone Exposed: No Periwound Skin Texture Texture Color No Abnormalities Noted: No No Abnormalities Noted: No Callus: No Atrophie Blanche: No Crepitus: No Cyanosis: No Excoriation: No Ecchymosis: No Induration: No Erythema: No [name], [name] ([id number]) [id number] Page 10 of 11 Rash: No Hemosiderin Staining: Yes Scarring: No Mottled: No Pallor:  No Moisture Rubor: No No Abnormalities Noted: No Dry / Scaly: No Temperature / Pain Maceration: No Temperature: No Abnormality Treatment Notes Wound #4 (Lower Leg) Wound Laterality: Right, Medial Cleanser Soap and Water Discharge Instruction: May shower and wash wound with dial antibacterial soap and water prior to dressing change. Vashe 5.8 (oz) Discharge Instruction: Cleanse the wound with Vashe prior to applying a clean dressing using gauze sponges, not tissue or cotton balls. Peri-Wound Care Triamcinolone 15 (g) Discharge Instruction: ***Use triamcinolone liberally**** Sween Lotion (Moisturizing lotion) Discharge Instruction: Apply moisturizing lotion as directed Topical Primary Dressing PolyMem Silver Non-Adhesive Dressing, 4.25x4.25 in Discharge Instruction: Apply to wound bed as instructed Secondary Dressing ABD Pad, 5x9 Discharge Instruction: Apply over primary dressing as directed. Secured With Compression Wrap Urgo K2, (equivalent to a 4 layer) two layer compression system, regular Discharge Instruction: Apply Urgo K2 as directed (alternative to 4 layer compression). Compression Stockings Add-Ons Electronic Signature(s) Signed: 04/08/2023 12:25:14 PM By: Robert Stall RN, BSN Entered By: [name] on 04/08/2023 05:26:07 -------------------------------------------------------------------------------- Vitals Details Patient Name: Date of Service: Robert Newer MES R. 04/08/2023 8:00 A M Medical Record Number: [id number] Patient Account Number: [id number] Date of Birth/Sex: Treating RN: July 06, 1980 (42 y.o. [name], [name] Primary Care Sulamita Lafountain: [name] Other Clinician: Referring [name]: Treating Kayman Snuffer/Extender: [name] in Treatment: 0 Vital Signs Time Taken: 08:14 Temperature (F): 98 Height (in): 75 Pulse (bpm): 92 Source: Stated Respiratory Rate (breaths/min): 20 Weight (lbs): 290 Blood Pressure (mmHg):  125/83 Source: Stated Reference Range: 80 - 120 mg / dl Body Mass Index (BMI): [address] KIMON, CORDER R ([id number]) [id number] Page 11 of 11 Signed: 04/08/2023 12:25:14 PM By: Robert Stall RN, BSN Entered By: [name] on 04/08/2023 05:18:54

## 2023-04-08 NOTE — Progress Notes (Signed)
Robert, Vasquez ([id number]) [phone].pdf Page 1 of 4 Visit Report for 04/08/2023 Abuse Risk Screen Details Patient Name: Date of Service: [name] Robert R. 04/08/2023 8:00 A M Medical Record Number: [id number] Patient Account Number: [id number] Date of Birth/Sex: Treating RN: [birth year] (42 y.o. [name], [name] Primary Care Brenten Janney: Hyman Hopes Other Clinician: Referring Jerman Tinnon: Treating Jakirah Zaun/Extender: Arletha Grippe in Treatment: 0 Abuse Risk Screen Items Answer ABUSE RISK SCREEN: Has anyone close to you tried to hurt or harm you recentlyo No Do you feel uncomfortable with anyone in your familyo No Has anyone forced you do things that you didnt want to doo No Electronic Signature(s) Signed: 04/08/2023 12:25:14 PM By: Shawn Stall RN, BSN Entered By: Shawn Stall on 04/08/2023 05:11:40 -------------------------------------------------------------------------------- Activities of Daily Living Details Patient Name: Date of Service: [name] Robert R. 04/08/2023 8:00 A M Medical Record Number: [id number] Patient Account Number: [id number] Date of Birth/Sex: Treating RN: 06-03-1980 (42 y.o. [name] Primary Care Dominic Rhome: Hyman Hopes Other Clinician: Referring Carita Sollars: Treating Decoda Van/Extender: Arletha Grippe in Treatment: 0 Activities of Daily Living Items Answer Activities of Daily Living (Please select one for each item) Drive Automobile Completely Able T Medications ake Completely Able Use T elephone Completely Able Care for Appearance Completely Able Use T oilet Completely Able Bath / Shower Completely Able Dress Self Completely Able Feed Self Completely Able Walk Completely Able Get In / Out Bed Completely Able Housework Completely Able Prepare Meals Completely Able Handle Money Completely Able Shop for Self Completely Able Electronic Signature(s) Signed: 04/08/2023  12:25:14 PM By: Shawn Stall RN, BSN Entered By: Shawn Stall on 04/08/2023 05:12:37 [name] ([id number]) [id number] Page 2 of 4 -------------------------------------------------------------------------------- Education Screening Details Patient Name: Date of Service: [name] Robert R. 04/08/2023 8:00 A M Medical Record Number: [id number] Patient Account Number: [id number] Date of Birth/Sex: Treating RN: 1980/06/18 (42 y.o. [name], [name] Primary Care Neamiah Sciarra: Hyman Hopes Other Clinician: Referring Robin Pafford: Treating Tariah Transue/Extender: Arletha Grippe in Treatment: 0 Primary Learner Assessed: Patient Learning Preferences/Education Level/Primary Language Learning Preference: Explanation, Demonstration, Printed Material Highest Education Level: High School Preferred Language: English Cognitive Barrier Language Barrier: No Translator Needed: No Memory Deficit: No Emotional Barrier: No Cultural/Religious Beliefs Affecting Medical Care: No Physical Barrier Impaired Vision: No Impaired Hearing: No Decreased Hand dexterity: No Knowledge/Comprehension Knowledge Level: High Comprehension Level: High Ability to understand written instructions: High Ability to understand verbal instructions: High Motivation Anxiety Level: Calm Cooperation: Cooperative Education Importance: Acknowledges Need Interest in Health Problems: Asks Questions Perception: Coherent Willingness to Engage in Self-Management High Activities: Readiness to Engage in Self-Management High Activities: Electronic Signature(s) Signed: 04/08/2023 12:25:14 PM By: Shawn Stall RN, BSN Entered By: Shawn Stall on 04/08/2023 05:12:55 -------------------------------------------------------------------------------- Fall Risk Assessment Details Patient Name: Date of Service: [name] Robert R. 04/08/2023 8:00 A M Medical Record Number: [id number] Patient  Account Number: [id number] Date of Birth/Sex: Treating RN: Nov 25, 1980 (42 y.o. [name] Primary Care Rakesh Dutko: Hyman Hopes Other Clinician: Referring Aldeen Riga: Treating Salah Burlison/Extender: Arletha Grippe in Treatment: 0 Fall Risk Assessment Items Have you had 2 or more falls in the last 12 monthso 0 No SHRAGA, KOWAL R ([id number]) [id number] Page 3 of 4 Have you had any fall that resulted in injury in the last 12 monthso 0 No FALLS RISK SCREEN History of falling - immediate or within 3 months 0 No Secondary diagnosis (Do  you have 2 or more medical diagnoseso) 0 No Ambulatory aid None/bed rest/wheelchair/nurse 0 Yes Crutches/cane/walker 0 No Furniture 0 No Intravenous therapy Access/Saline/Heparin Lock 0 No Gait/Transferring Normal/ bed rest/ wheelchair 0 Yes Weak (short steps with or without shuffle, stooped but able to lift head while walking, may seek 0 No support from furniture) Impaired (short steps with shuffle, may have difficulty arising from chair, head down, impaired 0 No balance) Mental Status Oriented to own ability 0 Yes Electronic Signature(s) Signed: 04/08/2023 12:25:14 PM By: Shawn Stall RN, BSN Entered By: Shawn Stall on 04/08/2023 05:13:07 -------------------------------------------------------------------------------- Foot Assessment Details Patient Name: Date of Service: [name] Robert R. 04/08/2023 8:00 A M Medical Record Number: [id number] Patient Account Number: [id number] Date of Birth/Sex: Treating RN: 12-08-1980 (42 y.o. [name] Primary Care Katianna Mcclenney: Hyman Hopes Other Clinician: Referring Aizley Stenseth: Treating Kaydn Kumpf/Extender: Arletha Grippe in Treatment: 0 Foot Assessment Items Site Locations + = Sensation present, - = Sensation absent, C = Callus, U = Ulcer R = Redness, W = Warmth, M = Maceration, PU = Pre-ulcerative lesion F = Fissure, S = Swelling, D  = Dryness Assessment Right: Left: Other Deformity: No No Prior Foot Ulcer: No No Prior Amputation: No No Charcot Joint: No No Ambulatory Status: Ambulatory Without Help GaitRASHON, [name] ([id number]) [id number] Page 4 of 4 Electronic Signature(s) Signed: 04/08/2023 12:25:14 PM By: Shawn Stall RN, BSN Entered By: Shawn Stall on 04/08/2023 05:27:14 -------------------------------------------------------------------------------- Nutrition Risk Screening Details Patient Name: Date of Service: [name] Robert R. 04/08/2023 8:00 A M Medical Record Number: [id number] Patient Account Number: [id number] Date of Birth/Sex: Treating RN: [birth year] (42 y.o. [name], [name] Primary Care Milburn Freeney: Hyman Hopes Other Clinician: Referring Gilberto Streck: Treating Crews Mccollam/Extender: Arletha Grippe in Treatment: 0 Height (in): 75 Weight (lbs): 275 Body Mass Index (BMI): 34.4 Nutrition Risk Screening Items Score Screening NUTRITION RISK SCREEN: I have an illness or condition that made me change the kind and/or amount of food I eat 2 Yes I eat fewer than two meals per day 0 No I eat few fruits and vegetables, or milk products 0 No I have three or more drinks of beer, liquor or wine almost every day 0 No I have tooth or mouth problems that make it hard for me to eat 0 No I don't always have enough money to buy the food I need 0 No I eat alone most of the time 0 No I take three or more different prescribed or over-the-counter drugs a day 0 No Without wanting to, I have lost or gained 10 pounds in the last six months 0 No I am not always physically able to shop, cook and/or feed myself 0 No Nutrition Protocols Good Risk Protocol 0 No interventions needed Moderate Risk Protocol High Risk Proctocol Risk Level: Good Risk Score: 2 Electronic Signature(s) Signed: 04/08/2023 12:25:14 PM By: Shawn Stall RN, BSN Entered By: Shawn Stall on 04/08/2023 05:13:21

## 2023-04-15 ENCOUNTER — Encounter (HOSPITAL_BASED_OUTPATIENT_CLINIC_OR_DEPARTMENT_OTHER): Payer: BC Managed Care – PPO | Admitting: General Surgery

## 2023-04-15 DIAGNOSIS — L97818 Non-pressure chronic ulcer of other part of right lower leg with other specified severity: Secondary | ICD-10-CM | POA: Diagnosis not present

## 2023-04-16 NOTE — Progress Notes (Signed)
 DEMTRIUS, ROUNDS ([id number]) [id number] Page 1 of 1 Visit Report for 04/15/2023 SuperBill Details Patient Name: Date of Service: [name] Robert MES R. 04/15/2023 Medical Record Number: [id number] Patient Account Number: [id number] Date of Birth/Sex: Treating RN: Apr 04, 1981 (43 y.o. 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

## 2023-04-16 NOTE — Progress Notes (Signed)
 ANDERSEN, IORIO ([id number]) [id number] Page 1 of 3 Visit Report for 04/15/2023 Arrival Information Details Patient Name: Date of Service: [name] Robert MES R. 04/15/2023 8:45 A M Medical Record Number: [id number] Patient Account Number: [id number] Date of Birth/Sex: Treating RN: [birth year] (42 y.o. M) Primary Care Lynetta Tomczak: Almarie Birmingham Other Clinician: Referring Jacora Hopkins: Treating Maron Stanzione/Extender: Marolyn Delon Almarie Birmingham Devra in Treatment: 1 Visit Information History Since Last Visit Added or deleted any medications: No Patient Arrived: Ambulatory Any new allergies or adverse reactions: No Arrival Time: 09:18 Had a fall or experienced change in No Accompanied By: self activities of daily living that may affect Transfer Assistance: None risk of falls: Patient Identification Verified: Yes Signs or symptoms of abuse/neglect since last visito No Secondary Verification Process Completed: Yes Hospitalized since last visit: No Patient Requires Transmission-Based Precautions: No Implantable device outside of the clinic excluding No Patient Has Alerts: No cellular tissue based products placed in the center since last visit: Has Dressing in Place as Prescribed: No Has Compression in Place as Prescribed: No Pain Present Now: No Notes unwrapped self and attempted to rewrap legs Electronic Signature(s) Signed: 04/15/2023 4:04:02 PM By: Wyn Iha Entered By: Wyn Iha on 04/15/2023 09:18:47 -------------------------------------------------------------------------------- Encounter Discharge Information Details Patient Name: Date of Service: [name] Robert MES R. 04/15/2023 8:45 A M Medical Record Number: [id number] Patient Account Number: [id number] Date of Birth/Sex: Treating RN: January 08, 1981 (42 y.o. M) Primary Care Tranell Wojtkiewicz: Almarie Birmingham Other Clinician: Wyn Iha Referring Christohper Dube: Treating Selvin Yun/Extender: Marolyn Delon Almarie Birmingham Devra in Treatment: 1 Encounter Discharge Information Items Discharge Condition: Stable Ambulatory Status: Ambulatory Discharge Destination: Home Transportation: Private Auto Accompanied By: self Schedule Follow-up Appointment: Yes Clinical Summary of Care: Electronic Signature(s) Signed: 04/15/2023 4:04:02 PM By: Wyn Iha Entered By: Wyn Iha on 04/15/2023 09:39:47 [name] ([id number]) [id number] Page 2 of 3 -------------------------------------------------------------------------------- Patient/Caregiver Education Details Patient Name: Date of Service: [name] Robert MES R. 12/31/2024andnbsp8:45 A M Medical Record Number: [id number] Patient Account Number: [id number] Date of Birth/Gender: Treating RN: 06-19-1980 (43 y.o. M) Primary Care Physician: Almarie Birmingham Other Clinician: Wyn Iha Referring Physician: Treating Physician/Extender: Marolyn Delon Almarie Birmingham Devra in Treatment: 1 Education Assessment Education Provided To: Patient Education Topics Provided Electronic Signature(s) Signed: 04/15/2023 4:04:02 PM By: Wyn Iha Entered By: Wyn Iha on 04/15/2023 09:39:27 -------------------------------------------------------------------------------- Wound Assessment Details Patient Name: Date of Service: [name] Robert MES R. 04/15/2023 8:45 A M Medical Record Number: [id number] Patient Account Number: [id number] Date of Birth/Sex: Treating RN: Dec 21, 1980 (42 y.o. M) Primary Care Sarahi Borland: Almarie Birmingham Other Clinician: Referring Janett Kamath: Treating Anielle Headrick/Extender: Marolyn Delon Almarie Birmingham Devra in Treatment: 1 Wound Status Wound Number: 4 Primary Etiology: Venous Leg Ulcer Wound Location: Right, Medial Lower Leg Wound Status: Open Wounding Event: Not Known Date Acquired: 01/29/2023 Weeks Of Treatment: 1 Clustered Wound: No Wound Measurements Length: (cm) 2 Width: (cm) 1 Depth: (cm)  0.1 Area: (cm) 1.571 Volume: (cm) 0.157 % Reduction in Area: 0% % Reduction in Volume: 0% Wound Description Classification: Full Thickness Without Exposed Support Exudate Amount: Medium Exudate Type: Serosanguineous Exudate Color: red, brown Structures Periwound Skin Texture Texture Color No Abnormalities Noted: No No Abnormalities Noted: No Moisture No Abnormalities Noted: No Treatment Notes [name] ([id number]) [phone].pdf Page 3 of 3 Wound #4 (Lower Leg) Wound Laterality: Right, Medial Cleanser Soap and Water Discharge Instruction: May shower and wash wound with dial antibacterial soap and water prior to dressing change. Vashe 5.8 (oz) Discharge Instruction: Cleanse  the wound with Vashe prior to applying a clean dressing using gauze sponges, not tissue or cotton balls. Peri-Wound Care Triamcinolone 15 (g) Discharge Instruction: ***Use triamcinolone liberally**** Sween Lotion (Moisturizing lotion) Discharge Instruction: Apply moisturizing lotion as directed Topical Primary Dressing PolyMem Silver  Non-Adhesive Dressing, 4.25x4.25 in Discharge Instruction: Apply to wound bed as instructed Secondary Dressing ABD Pad, 5x9 Discharge Instruction: Apply over primary dressing as directed. Secured With Compression Wrap Urgo K2, (equivalent to a 4 layer) two layer compression system, regular Discharge Instruction: Apply Urgo K2 as directed (alternative to 4 layer compression). Compression Stockings Add-Ons Electronic Signature(s) Signed: 04/15/2023 4:04:02 PM By: Wyn Iha Entered By: Wyn Iha on 04/15/2023 09:38:54 -------------------------------------------------------------------------------- Vitals Details Patient Name: Date of Service: [name] Robert MES R. 04/15/2023 8:45 A M Medical Record Number: [id number] Patient Account Number: [id number] Date of Birth/Sex: Treating RN: 08/10/1980 (42 y.o. M) Primary Care Tanique Matney: Almarie Birmingham Other Clinician: Referring Rose-Marie Hickling: Treating Lenzie Sandler/Extender: Marolyn Delon Almarie Birmingham Devra in Treatment: 1 Vital Signs Time Taken: 09:18 Reference Range: 80 - 120 mg / dl Height (in): 75 Weight (lbs): 290 Body Mass Index (BMI): 36.2 Electronic Signature(s) Signed: 04/15/2023 4:04:02 PM By: Wyn Iha Entered By: Wyn Iha on 04/15/2023 09:18:53

## 2023-04-22 ENCOUNTER — Encounter (HOSPITAL_BASED_OUTPATIENT_CLINIC_OR_DEPARTMENT_OTHER): Payer: BC Managed Care – PPO | Attending: Internal Medicine | Admitting: Internal Medicine

## 2023-04-22 DIAGNOSIS — R7303 Prediabetes: Secondary | ICD-10-CM | POA: Insufficient documentation

## 2023-04-22 DIAGNOSIS — I7300 Raynaud's syndrome without gangrene: Secondary | ICD-10-CM | POA: Insufficient documentation

## 2023-04-22 DIAGNOSIS — L97818 Non-pressure chronic ulcer of other part of right lower leg with other specified severity: Secondary | ICD-10-CM | POA: Insufficient documentation

## 2023-04-22 DIAGNOSIS — I87331 Chronic venous hypertension (idiopathic) with ulcer and inflammation of right lower extremity: Secondary | ICD-10-CM | POA: Insufficient documentation

## 2023-04-24 NOTE — Progress Notes (Signed)
 JOSETH, WEIGEL ([id number]) [id number] Page 1 of 9 Visit Report for 04/22/2023 Arrival Information Details Patient Name: Date of Service: Robert MALVA EVERITT MILUS MES Vasquez. 04/22/2023 9:00 A M Medical Record Number: [id number] Patient Account Number: [id number] Date of Birth/Sex: Treating RN: Nov 28, 1980 (42 y.o. [name], [name] Primary Care Carlos Heber: Almarie Birmingham Other Clinician: Referring Tameisha Covell: Treating Rivkah Wolz/Extender: Rosan Harlene Almarie [place] Devra in Treatment: 2 Visit Information History Since Last Visit Added or deleted any medications: No Patient Arrived: Ambulatory Any new allergies or adverse reactions: No Arrival Time: 09:24 Had a fall or experienced change in No Accompanied By: self activities of daily living that may affect Transfer Assistance: None risk of falls: Patient Identification Verified: Yes Signs or symptoms of abuse/neglect since last visito No Secondary Verification Process Completed: Yes Hospitalized since last visit: No Patient Requires Transmission-Based Precautions: No Implantable device outside of the clinic excluding No Patient Has Alerts: No cellular tissue based products placed in the center since last visit: Has Dressing in Place as Prescribed: Yes Has Compression in Place as Prescribed: Yes Pain Present Now: No Electronic Signature(s) Signed: 04/22/2023 4:20:27 PM By: Vasquez Maxwell RN Entered By: Vasquez Maxwell on 04/22/2023 09:24:30 -------------------------------------------------------------------------------- Clinic Level of Care Assessment Details Patient Name: Date of Service: Robert MALVA EVERITT MILUS MES Vasquez. 04/22/2023 9:00 A M Medical Record Number: [id number] Patient Account Number: [id number] Date of Birth/Sex: Treating RN: 1981/04/14 (42 y.o. M) Zochol, Jamie Primary Care Draco Malczewski: Almarie Birmingham Other Clinician: Referring Aden Youngman: Treating Arthurine Oleary/Extender: Rosan Harlene Almarie Birmingham Devra in Treatment: 2 Clinic  Level of Care Assessment Items TOOL 1 Quantity Score []  - 0 Use when EandM and Procedure is performed on INITIAL visit ASSESSMENTS - Nursing Assessment / Reassessment []  - 0 General Physical Exam (combine w/ comprehensive assessment (listed just below) when performed on new pt. evals) []  - 0 Comprehensive Assessment (HX, ROS, Risk Assessments, Wounds Hx, etc.) ASSESSMENTS - Wound and Skin Assessment / Reassessment []  - 0 Dermatologic / Skin Assessment (not related to wound area) ASSESSMENTS - Ostomy and/or Continence Assessment and Care []  - 0 Incontinence Assessment and Management []  - 0 Ostomy Care Assessment and Management (repouching, etc.) PROCESS - Coordination of Care []  - 0 Simple Patient / Family Education for ongoing care ADEDAMOLA, SETO ([id number]) [id number] Page 2 of 9 []  - 0 Complex (extensive) Patient / Family Education for ongoing care []  - 0 Staff obtains Chiropractor, Records, T Results / Process Orders est []  - 0 Staff telephones HHA, Nursing Homes / Clarify orders / etc []  - 0 Routine Transfer to another Facility (non-emergent condition) []  - 0 Routine Hospital Admission (non-emergent condition) []  - 0 New Admissions / Manufacturing Engineer / Ordering NPWT Apligraf, etc. , []  - 0 Emergency Hospital Admission (emergent condition) PROCESS - Special Needs []  - 0 Pediatric / Minor Patient Management []  - 0 Isolation Patient Management []  - 0 Hearing / Language / Visual special needs []  - 0 Assessment of Community assistance (transportation, D/C planning, etc.) []  - 0 Additional assistance / Altered mentation []  - 0 Support Surface(s) Assessment (bed, cushion, seat, etc.) INTERVENTIONS - Miscellaneous []  - 0 External ear exam []  - 0 Patient Transfer (multiple staff / Nurse, Adult / Similar devices) []  - 0 Simple Staple / Suture removal (25 or less) []  - 0 Complex Staple / Suture removal (26 or more) []  -  0 Hypo/Hyperglycemic Management (do not check if billed separately) []  - 0 Ankle / Brachial Index (ABI) - do not check if billed  separately Has the patient been seen at the hospital within the last three years: Yes Total Score: 0 Level Of Care: ____ Electronic Signature(s) Signed: 04/22/2023 4:32:03 PM By: Zochol, Jamie RN Entered By: Zochol, Jamie on 04/22/2023 09:47:55 -------------------------------------------------------------------------------- Compression Therapy Details Patient Name: Date of Service: Robert MALVA EVERITT MILUS MES Vasquez. 04/22/2023 9:00 A M Medical Record Number: [id number] Patient Account Number: [id number] Date of Birth/Sex: Treating RN: 1980/04/18 (42 y.o. M) Zochol, Jamie Primary Care Talmadge Ganas: Almarie Birmingham Other Clinician: Referring Denaly Gatling: Treating Jovani Flury/Extender: Rosan Harlene Almarie Birmingham Devra in Treatment: 2 Compression Therapy Performed for Wound Assessment: Wound #4 Right,Medial Lower Leg Performed By: Clinician Zochol, Jamie, RN Compression Type: Four Layer Post Procedure Diagnosis Same as Pre-procedure Notes urgo regular Electronic Signature(s) Signed: 04/22/2023 4:32:03 PM By: Zochol, Jamie RN Entered By: Zochol, Jamie on 04/22/2023 09:48:24 [name] ([id number]) [id number] Page 3 of 9 -------------------------------------------------------------------------------- Encounter Discharge Information Details Patient Name: Date of Service: Robert MALVA EVERITT MILUS MES Vasquez. 04/22/2023 9:00 A M Medical Record Number: [id number] Patient Account Number: [id number] Date of Birth/Sex: Treating RN: Sep 12, 1980 (42 y.o. M) Zochol, Jamie Primary Care Lynnsie Linders: Almarie Birmingham Other Clinician: Referring Nita Whitmire: Treating Dakhari Zuver/Extender: Rosan Harlene Almarie Birmingham Devra in Treatment: 2 Encounter Discharge Information Items Discharge Condition: Stable Ambulatory Status: Ambulatory Discharge Destination: Home Transportation: Ambulance Accompanied  By: self Schedule Follow-up Appointment: Yes Clinical Summary of Care: Electronic Signature(s) Signed: 04/22/2023 4:32:03 PM By: Zochol, Jamie RN Entered By: Zochol, Jamie on 04/22/2023 10:00:01 -------------------------------------------------------------------------------- Lower Extremity Assessment Details Patient Name: Date of Service: Robert MALVA EVERITT MILUS MES Vasquez. 04/22/2023 9:00 A M Medical Record Number: [id number] Patient Account Number: [id number] Date of Birth/Sex: Treating RN: 1980/10/01 (42 y.o. [name], [name] Primary Care Axten Pascucci: Almarie Birmingham Other Clinician: Referring Judyann Casasola: Treating Chantea Surace/Extender: Rosan Harlene Almarie Birmingham Devra in Treatment: 2 Edema Assessment Assessed: Colletta: No] Glenis: No] Edema: [Left: Yes] [Right: Yes] Calf Left: Right: Point of Measurement: 35 cm From Medial Instep 46.5 cm 47 cm Ankle Left: Right: Point of Measurement: 12 cm From Medial Instep 29 cm 28 cm Vascular Assessment Pulses: Dorsalis Pedis Palpable: [Left:Yes] [Right:Yes] Posterior Tibial Palpable: [Left:Yes] [Right:Yes] Extremity colors, hair growth, and conditions: Extremity Color: [Left:Normal] [Right:Normal] Hair Growth on Extremity: [Left:Yes] [Right:Yes] Temperature of Extremity: [Left:Warm] [Right:Warm] Capillary Refill: [Left:< 3 seconds] [Right:< 3 seconds] Dependent Rubor: [Left:No] [Right:No] Lipodermatosclerosis: [Left:No] [Right:No [phone].pdf Page 4 of 9] Toe Nail Assessment Left: Right: Thick: Yes Yes Discolored: Yes Yes Deformed: Yes Yes Improper Length and Hygiene: Yes Yes Electronic Signature(s) Signed: 04/22/2023 4:20:27 PM By: Vasquez Maxwell RN Entered By: Vasquez Maxwell on 04/22/2023 09:26:32 -------------------------------------------------------------------------------- Multi Wound Chart Details Patient Name: Date of Service: Robert MALVA EVERITT MILUS MES Vasquez. 04/22/2023 9:00 A M Medical Record Number: [id number] Patient Account  Number: [id number] Date of Birth/Sex: Treating RN: 02-23-1981 (42 y.o. M) Primary Care Graceson Nichelson: Almarie Birmingham Other Clinician: Referring Delon Revelo: Treating Bronte Sabado/Extender: Rosan Harlene Almarie Birmingham Devra in Treatment: 2 Vital Signs Height(in): 75 Pulse(bpm): 73 Weight(lbs): 290 Blood Pressure(mmHg): 165/80 Body Mass Index(BMI): 36.2 Temperature(F): 98.2 Respiratory Rate(breaths/min): 17 [4:Photos:] [N/A:N/A] Right, Medial Lower Leg N/A N/A Wound Location: Not Known N/A N/A Wounding Event: Venous Leg Ulcer N/A N/A Primary Etiology: Raynauds, Gout N/A N/A Comorbid History: 01/29/2023 N/A N/A Date Acquired: 2 N/A N/A Weeks of Treatment: Open N/A N/A Wound Status: No N/A N/A Wound Recurrence: 0.5x0.3x0.1 N/A N/A Measurements L x W x D (cm) 0.118 N/A N/A A (cm) : rea 0.012 N/A N/A Volume (cm) : 92.50% N/A N/A % Reduction  in A rea: 92.40% N/A N/A % Reduction in Volume: Full Thickness Without Exposed N/A N/A Classification: Support Structures Medium N/A N/A Exudate A mount: Serosanguineous N/A N/A Exudate Type: red, brown N/A N/A Exudate Color: Distinct, outline attached N/A N/A Wound Margin: Medium (34-66%) N/A N/A Granulation Amount: Red, Pink N/A N/A Granulation Quality: Medium (34-66%) N/A N/A Necrotic Amount: Eschar, Adherent Slough N/A N/A Necrotic Tissue: Fat Layer (Subcutaneous Tissue): Yes N/A N/A Exposed Structures: Fascia: No Tendon: No Muscle: No Joint: No Bone: No Small (1-33%) N/A N/A EpithelializationJAMMY, PLOTKIN ([id number]) [id number] Page 5 of 9 Excoriation: No N/A N/A Periwound Skin Texture: Induration: No Callus: No Crepitus: No Rash: No Scarring: No Maceration: No N/A N/A Periwound Skin Moisture: Dry/Scaly: No Atrophie Blanche: No N/A N/A Periwound Skin Color: Cyanosis: No Ecchymosis: No Erythema: No Hemosiderin Staining: No Mottled: No Pallor: No Rubor: No No Abnormality N/A  N/A Temperature: Yes N/A N/A Tenderness on Palpation: Compression Therapy N/A N/A Procedures Performed: Treatment Notes Electronic Signature(s) Signed: 04/23/2023 5:26:35 PM By: Rosan Raisin DO Entered By: Rosan Raisin on 04/22/2023 09:55:55 -------------------------------------------------------------------------------- Multi-Disciplinary Care Plan Details Patient Name: Date of Service: Robert MALVA EVERITT MILUS MES Vasquez. 04/22/2023 9:00 A M Medical Record Number: [id number] Patient Account Number: [id number] Date of Birth/Sex: Treating RN: 09-Nov-1980 (42 y.o. M) Zochol, Jamie Primary Care Orpheus Hayhurst: Almarie Birmingham Other Clinician: Referring Zurie Platas: Treating Rodrigo Mcgranahan/Extender: Rosan Raisin Almarie [place] Devra in Treatment: 2 Active Inactive Orientation to the Wound Care Program Nursing Diagnoses: Knowledge deficit related to the wound healing center program Goals: Patient/caregiver will verbalize understanding of the Wound Healing Center Program Date Initiated: 04/08/2023 Target Resolution Date: 04/17/2023 Goal Status: Active Interventions: Provide education on orientation to the wound center Notes: Pain, Acute or Chronic Nursing Diagnoses: Pain, acute or chronic: actual or potential Potential alteration in comfort, pain Goals: Patient will verbalize adequate pain control and receive pain control interventions during procedures as needed Date Initiated: 04/08/2023 Target Resolution Date: 05/22/2023 Goal Status: Active Patient/caregiver will verbalize comfort level met Date Initiated: 04/08/2023 Target Resolution Date: 05/30/2023 Goal Status: Active Interventions: Encourage patient to take pain medications as prescribed [name], [name] ([id number]) [phone].pdf Page 6 of 9 Provide education on pain management Reposition patient for comfort Treatment Activities: Administer pain control measures as ordered : 04/08/2023 Notes: Wound/Skin Impairment Nursing  Diagnoses: Knowledge deficit related to ulceration/compromised skin integrity Goals: Patient/caregiver will verbalize understanding of skin care regimen Date Initiated: 04/08/2023 Target Resolution Date: 04/25/2023 Goal Status: Active Interventions: Assess patient/caregiver ability to perform ulcer/skin care regimen upon admission and as needed Assess ulceration(s) every visit Provide education on ulcer and skin care Treatment Activities: Skin care regimen initiated : 04/08/2023 Topical wound management initiated : 04/08/2023 Notes: Electronic Signature(s) Signed: 04/22/2023 4:32:03 PM By: Zochol, Jamie RN Entered By: Zochol, Jamie on 04/22/2023 09:46:19 -------------------------------------------------------------------------------- Pain Assessment Details Patient Name: Date of Service: Robert MALVA EVERITT MILUS MES Vasquez. 04/22/2023 9:00 A M Medical Record Number: [id number] Patient Account Number: [id number] Date of Birth/Sex: Treating RN: Aug 16, 1980 (42 y.o. [name], [name] Primary Care Adonia Porada: Almarie Birmingham Other Clinician: Referring Yadier Bramhall: Treating Sabrea Sankey/Extender: Rosan Raisin Almarie Birmingham Devra in Treatment: 2 Active Problems Location of Pain Severity and Description of Pain Patient Has Paino No Site Locations Pain Management and Medication Current Pain Management: [name], [name] ([id number]) [phone].pdf Page 7 of 9 Electronic Signature(s) Signed: 04/22/2023 4:20:27 PM By: Vasquez Maxwell RN Entered By: Vasquez Maxwell on 04/22/2023 09:26:10 -------------------------------------------------------------------------------- Patient/Caregiver Education Details Patient Name: Date of Service: Robert MALVA EVERITT, MILUS  MES Vasquez. 1/7/2025andnbsp9:00 A M Medical Record Number: [id number] Patient Account Number: [id number] Date of Birth/Gender: Treating RN: 01/07/1981 (43 y.o. M) Zochol, Jamie Primary Care Physician: Almarie Birmingham Other Clinician: Referring  Physician: Treating Physician/Extender: Rosan Harlene Almarie Birmingham Devra in Treatment: 2 Education Assessment Education Provided To: Patient Education Topics Provided Pain: Methods: Explain/Verbal Responses: Reinforcements needed, State content correctly Electronic Signature(s) Signed: 04/22/2023 4:32:03 PM By: Zochol, Jamie RN Entered By: Zochol, Jamie on 04/22/2023 09:47:44 -------------------------------------------------------------------------------- Wound Assessment Details Patient Name: Date of Service: Robert MALVA EVERITT MILUS MES Vasquez. 04/22/2023 9:00 A M Medical Record Number: [id number] Patient Account Number: [id number] Date of Birth/Sex: Treating RN: 01/28/1981 (42 y.o. [name], [name] Primary Care Florentina Marquart: Almarie Birmingham Other Clinician: Referring Arleth Mccullar: Treating Takasha Vetere/Extender: Rosan Harlene Almarie Birmingham Weeks in Treatment: 2 Wound Status Wound Number: 4 Primary Etiology: Venous Leg Ulcer Wound Location: Right, Medial Lower Leg Wound Status: Open Wounding Event: Not Known Comorbid History: Raynauds, Gout Date Acquired: 01/29/2023 Weeks Of Treatment: 2 Clustered Wound: No Photos DEMETRIA, LIGHTSEY Vasquez ([id number]) [id number] Page 8 of 9 Wound Measurements Length: (cm) 0.5 Width: (cm) 0.3 Depth: (cm) 0.1 Area: (cm) 0.118 Volume: (cm) 0.012 % Reduction in Area: 92.5% % Reduction in Volume: 92.4% Epithelialization: Small (1-33%) Tunneling: No Undermining: No Wound Description Classification: Full Thickness Without Exposed Support Structures Wound Margin: Distinct, outline attached Exudate Amount: Medium Exudate Type: Serosanguineous Exudate Color: red, brown Foul Odor After Cleansing: No Slough/Fibrino Yes Wound Bed Granulation Amount: Medium (34-66%) Exposed Structure Granulation Quality: Red, Pink Fascia Exposed: No Necrotic Amount: Medium (34-66%) Fat Layer (Subcutaneous Tissue) Exposed: Yes Necrotic Quality: Eschar, Adherent  Slough Tendon Exposed: No Muscle Exposed: No Joint Exposed: No Bone Exposed: No Periwound Skin Texture Texture Color No Abnormalities Noted: No No Abnormalities Noted: No Callus: No Atrophie Blanche: No Crepitus: No Cyanosis: No Excoriation: No Ecchymosis: No Induration: No Erythema: No Rash: No Hemosiderin Staining: No Scarring: No Mottled: No Pallor: No Moisture Rubor: No No Abnormalities Noted: No Dry / Scaly: No Temperature / Pain Maceration: No Temperature: No Abnormality Tenderness on Palpation: Yes Treatment Notes Wound #4 (Lower Leg) Wound Laterality: Right, Medial Cleanser Soap and Water Discharge Instruction: May shower and wash wound with dial antibacterial soap and water prior to dressing change. Vashe 5.8 (oz) Discharge Instruction: Cleanse the wound with Vashe prior to applying a clean dressing using gauze sponges, not tissue or cotton balls. Peri-Wound Care Triamcinolone 15 (g) Discharge Instruction: ***Use triamcinolone liberally**** Sween Lotion (Moisturizing lotion) Discharge Instruction: Apply moisturizing lotion as directed Topical Gentamicin Discharge Instruction: As directed by physician Mupirocin  Ointment Discharge Instruction: Apply Mupirocin  (Bactroban ) as instructed MERRIC, YOST ([id number]) [id number] Page 9 of 9 Primary Dressing PolyMem Silver  Non-Adhesive Dressing, 4.25x4.25 in Discharge Instruction: Apply to wound bed as instructed Secondary Dressing ABD Pad, 5x9 Discharge Instruction: Apply over primary dressing as directed. Secured With Compression Wrap Urgo K2, (equivalent to a 4 layer) two layer compression system, regular Discharge Instruction: Apply Urgo K2 as directed (alternative to 4 layer compression). Compression Stockings Add-Ons Electronic Signature(s) Signed: 04/22/2023 4:20:27 PM By: Vasquez Maxwell RN Entered By: Vasquez Maxwell on 04/22/2023  09:38:14 -------------------------------------------------------------------------------- Vitals Details Patient Name: Date of Service: Robert MALVA EVERITT MILUS MES Vasquez. 04/22/2023 9:00 A M Medical Record Number: [id number] Patient Account Number: [id number] Date of Birth/Sex: Treating RN: 1980/12/31 (42 y.o. [name] Maxwell Primary Care Roczen Waymire: Almarie Birmingham Other Clinician: Referring Clodagh Odenthal: Treating Mirza Fessel/Extender: Rosan Harlene Almarie Birmingham Devra in Treatment: 2 Vital Signs Time Taken: 09:26 Temperature (F): 98.2 Height (  in): 75 Pulse (bpm): 73 Weight (lbs): 290 Respiratory Rate (breaths/min): 17 Body Mass Index (BMI): 36.2 Blood Pressure (mmHg): 165/80 Reference Range: 80 - 120 mg / dl Electronic Signature(s) Signed: 04/22/2023 4:20:27 PM By: Lanis Maxwell RN Entered By: Lanis Maxwell on 04/22/2023 09:27:40

## 2023-04-24 NOTE — Progress Notes (Signed)
 MICHIO, THIER ([id number]) [id number] Page 1 of 7 Visit Report for 04/22/2023 Chief Complaint Document Details Patient Name: Date of Service: Robert MALVA EVERITT MILUS MES R. 04/22/2023 9:00 A M Medical Record Number: [id number] Patient Account Number: [id number] Date of Birth/Sex: Treating RN: 1981-04-15 (43 y.o. M) Primary Care Provider: Almarie Birmingham Other Clinician: Referring Provider: Treating Provider/Extender: Rosan Harlene Almarie Birmingham Devra in Treatment: 2 Information Obtained from: Patient Chief Complaint Left lateral foot ulcer 12/08/17; patient is here for review of a wound on his right lateral foot and left medial malleolus 04/08/2023; patient returns to our clinic with a painful wound on the right medial ankle Electronic Signature(s) Signed: 04/23/2023 5:26:35 PM By: Rosan Harlene DO Entered By: Rosan Harlene on 04/22/2023 09:56:08 -------------------------------------------------------------------------------- HPI Details Patient Name: Date of Service: Robert MALVA EVERITT MILUS MES R. 04/22/2023 9:00 A M Medical Record Number: [id number] Patient Account Number: [id number] Date of Birth/Sex: Treating RN: [birth year] (42 y.o. M) Primary Care Provider: Almarie Birmingham Other Clinician: Referring Provider: Treating Provider/Extender: Rosan Harlene Almarie Birmingham Devra in Treatment: 2 History of Present Illness HPI Description: 08/06/17 on evaluation today patient presents for a left lateral foot ulcer which he states began as what sounds to be an abscess. He was seen in the ER in March but states this actually first showed up in February. He showed it to a friend and they felt that it might be a spider bite. He eventually did go to the ER and that encounter was on 07/24/17. The wound that time was much larger than what it appears to be at this point based on the picture that I did review today in his chart. Nonetheless he was placed on antibiotics although I'm not sure what  these antibiotics were as I cannot find them in the computer system at this point. He does have significant discomfort unfortunately. Patient has a history of rheumatoid arthritis with connective tissue disease but otherwise no significant history he is not a current smoker although he was formally he also no longer uses marijuana which he had in the past. He quit in 2012. He has been basically putting Vaseline on the area but nothing else has been done recently. He obviously was covering this with the bandage. 08/13/17 on evaluation today patient appears to be doing very good in regard to his left lateral lower extremity/foot ulcer. He has been tolerating the dressing changes without complication and overall this appears to be smaller today. 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 08/20/17 on evaluation today patient appears to be doing very well in regard to his left lateral foot ulcer. He has been tolerating the dressing changes without complication the Prisma does seem to be helping. With that being said he does still continue to have pain mainly in the posterior wound the more anterior wound is not really hurting him. Fortunately there is no evidence of infection showing up at this point. READMISSION 12/08/17 Is a patient that we had in clinic for a few visits in May of this year. 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 He tells me he was seen by her rheumatologist while he was incarcerated at [facility] several years ago we made this diagnosis. I don't have any of this information available. By talking to the patient he does  clearly have Raynaud's phenomenon very cold sensitive he has had wounds in his hands and noted discoloration. On this occasion he developed a wound on his right lateral  foot in June this started as a purplish discoloration and then expanded. It is very painful he is been using peroxide and a bandage. He developed another wound on the left medial malleolus 2-3 weeks ago. He is in the ER on July 23 and was given a prescription for doxycycline . He may have received additional antibiotics from his primary doctor nobody is really sure about this. He has a history of gout, Raynaud's and connective tissue diseaseo Overlap syndrome. He has an ABI on the right of 1.09 and on the left at 1.18. 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 On the right lateral foot he has 2 open areas. The more proximal one has depth and perhaps a small superficial tendon at the base. The distal one has no depth and the surface looks quite good. Left lateral malleolus measuring smaller. We've been using silver  collagen 12/29/17; not much improvement. He has 3 open areas on the right lateral foot with close proximity to each other. The more proximal one as some depth the others are superficial. The area over the left medial ankle/medial malleolus has some fixed skin around the circumference. We've been using silver  collagen however I changed him to and alginate today 01/06/2018; some improvement in wound area especially on the right lateral foot. The wounds are 3 small areas that have contracted. The area on the left medial malleolus also looks smaller 01/30/2018; he has areas on the right lateral foot and left medial ankle. Both of these look better than the last time. We only have one area on the right however it still has some depth. The area on the left medial ankle is superficial and appears to have healthy tissue. We have been using silver  alginate READMISSION 04/08/2023 This is a patient we had in the clinic in 2019 on 2  separate occasions. I think when he was here last I looked after him. He had wounds on the right lateral foot and left medial ankle. We simply cleaned out the wounds dressed him and put him in compression and these healed on both occasions fairly quickly. He says that nothing really happened until about 2-1/2 months ago he had an opening on the right medial lower leg just above the ankle which was exceptionally painful. 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 He has been putting Silvadene  cream on this. He had a course of doxycycline  neither 1 of these really helped. He finds this wound painful. Past medical history the patient is a prediabetic with a recent hemoglobin A1c of 6. He has longstanding Raynaud's phenomenon. At some points in his past he has been queried to have some form of connective tissue disease. I did not see any rheumatologic tests in epic although I did find a normal ESR and C- reactive protein from 2012. He is a non-smoker ABI in our clinic was 1 on the right and 1.14 on the left 04/22/2023; patient presents for follow-up. We have been using PolyMem under compression wrap to the right lower extremity wound. Wound is smaller today. He has no issues or complaints today. He denies signs of  infection. Electronic Signature(s) Signed: 04/23/2023 5:26:35 PM By: Rosan Raisin DO Entered By: Rosan Raisin on 04/22/2023 09:56:53 -------------------------------------------------------------------------------- Physical Exam Details Patient Name: Date of Service: Robert MALVA EVERITT MILUS MES R. 04/22/2023 9:00 A M Medical Record Number: [id number] Patient Account Number: [id number] Date of Birth/Sex: Treating RN: 1980-12-01 (42 y.o. M) Primary Care Provider: Almarie Birmingham Other Clinician: Referring Provider: Treating Provider/Extender: Rosan Raisin Almarie Birmingham Devra in Treatment: 2 Constitutional respirations regular,  non-labored and within target range for patient.. Cardiovascular 2+ dorsalis pedis/posterior tibialis pulses. Psychiatric pleasant and cooperative. Notes T the right medial lower leg above the ankle there is a small open wound with granulation tissue at the opening. No surrounding signs of infection including o increased warmth, erythema or purulent drainage. Nonpitting edema. Electronic Signature(s) Signed: 04/23/2023 5:26:35 PM By: Rosan Raisin DO Entered By: Rosan Raisin on 04/22/2023 09:57:49 -------------------------------------------------------------------------------- Physician Orders Details Patient Name: Date of Service: Robert MALVA EVERITT, JA MES R. 04/22/2023 9:00 A ISIAAH, CUERVO R ([id number]) [id number] Page 3 of 7 Medical Record Number: [id number] Patient Account Number: [id number] Date of Birth/Sex: Treating RN: 12/16/1980 (43 y.o. M) Zochol, Jamie Primary Care Provider: Almarie Birmingham Other Clinician: Referring Provider: Treating Provider/Extender: Rosan Raisin Almarie Birmingham Devra in Treatment: 2 Verbal / Phone Orders: No Diagnosis Coding Follow-up Appointments ppointment in 2 weeks. - ***Dr. Rosan**** (front office to schedule) Return A Nurse Visit: - 0845 Tuesday 04/15/2023 nurse visit (front office to schedule) Other: - [facility] and V[facility] will call you to schedule the tests. The morning of the test remove the compression wrap, cover wound the bandage, and go to have the tests performed. Anesthetic (In clinic) Topical Lidocaine 4% applied to wound bed Bathing/ Shower/ Hygiene May shower with protection but do not get wound dressing(s) wet. Protect dressing(s) with water repellant cover (for example, large plastic bag) or a cast cover and may then take shower. Edema Control - Orders / Instructions Elevate legs to the level of the heart or above for 30 minutes daily and/or when sitting for 3-4 times a day throughout the day. Avoid  standing for long periods of time. Patient to wear own compression stockings every day. - to left leg apply in the morning and remove at night. purchase compression stockings tightness 20-30 mm/Hg pressure. Exercise regularly Moisturize legs daily. - left leg Wound Treatment Wound #4 - Lower Leg Wound Laterality: Right, Medial Cleanser: Soap and Water 1 x Per Week/30 Days Discharge Instructions: May shower and wash wound with dial antibacterial soap and water prior to dressing change. Cleanser: Vashe 5.8 (oz) 1 x Per Week/30 Days Discharge Instructions: Cleanse the wound with Vashe prior to applying a clean dressing using gauze sponges, not tissue or cotton balls. Peri-Wound Care: Triamcinolone 15 (g) 1 x Per Week/30 Days Discharge Instructions: ***Use triamcinolone liberally**** Peri-Wound Care: Sween Lotion (Moisturizing lotion) 1 x Per Week/30 Days Discharge Instructions: Apply moisturizing lotion as directed Topical: Gentamicin 1 x Per Week/30 Days Discharge Instructions: As directed by physician Topical: Mupirocin  Ointment 1 x Per Week/30 Days Discharge Instructions: Apply Mupirocin  (Bactroban ) as instructed Prim Dressing: PolyMem Silver  Non-Adhesive Dressing, 4.25x4.25 in 1 x Per Week/30 Days ary Discharge Instructions: Apply to wound bed as instructed Secondary Dressing: ABD Pad, 5x9 1 x Per Week/30 Days Discharge Instructions: Apply over primary dressing as directed. Compression Wrap: Urgo K2, (equivalent to a 4 layer) two layer compression system, regular 1 x Per Week/30 Days Discharge Instructions: Apply Urgo K2 as directed (alternative to 4 layer compression). Electronic Signature(s) Signed:  04/23/2023 5:26:35 PM By: Rosan Raisin DO Entered By: Rosan Raisin on 04/22/2023 09:58:55 -------------------------------------------------------------------------------- Problem List Details Patient Name: Date of Service: Robert MALVA EVERITT MILUS MES R. 04/22/2023 9:00 A M Medical Record  Number: [id number] Patient Account Number: [id number] Date of Birth/Sex: Treating RN: 02-06-1981 (42 y.o. JASYAH, THEURER ([id number]) [id number] Page 4 of 7 Primary Care Provider: Almarie Birmingham Other Clinician: Referring Provider: Treating Provider/Extender: Rosan Raisin Almarie Birmingham Devra in Treatment: 2 Active Problems ICD-10 Encounter Code Description Active Date MDM Diagnosis I87.331 Chronic venous hypertension (idiopathic) with ulcer and inflammation of right 04/08/2023 No Yes lower extremity L97.818 Non-pressure chronic ulcer of other part of right lower leg with other specified 04/08/2023 No Yes severity I73.00 Raynaud's syndrome without gangrene 04/08/2023 No Yes Inactive Problems Resolved Problems Electronic Signature(s) Signed: 04/23/2023 5:26:35 PM By: Rosan Raisin DO Entered By: Rosan Raisin on 04/22/2023 09:55:45 -------------------------------------------------------------------------------- Progress Note Details Patient Name: Date of Service: Robert MALVA EVERITT MILUS MES R. 04/22/2023 9:00 A M Medical Record Number: [id number] Patient Account Number: [id number] Date of Birth/Sex: Treating RN: 05-Jul-1980 (42 y.o. M) Primary Care Provider: Almarie Birmingham Other Clinician: Referring Provider: Treating Provider/Extender: Rosan Raisin Almarie Birmingham Devra in Treatment: 2 Subjective Chief Complaint Information obtained from Patient Left lateral foot ulcer 12/08/17; patient is here for review of a wound on his right lateral foot and left medial malleolus 04/08/2023; patient returns to our clinic with a painful wound on the right medial ankle History of Present Illness (HPI) 08/06/17 on evaluation today patient presents for a left lateral foot ulcer which he states began as what sounds to be an abscess. He was seen in the ER in March but states this actually first showed up in February. He showed it to a friend and they felt that it might be a  spider bite. He eventually did go to the ER and that encounter was on 07/24/17. The wound that time was much larger than what it appears to be at this point based on the picture that I did review today in his chart. Nonetheless he was placed on antibiotics although I'm not sure what these antibiotics were as I cannot find them in the computer system at this point. He does have significant discomfort unfortunately. Patient has a history of rheumatoid arthritis with connective tissue disease but otherwise no significant history he is not a current smoker although he was formally he also no longer uses marijuana which he had in the past. He quit in 2012. He has been basically putting Vaseline on the area but nothing else has been done recently. He obviously was covering this with the bandage. 08/13/17 on evaluation today patient appears to be doing very good in regard to his left lateral lower extremity/foot ulcer. He has been tolerating the dressing changes without complication and overall this appears to be smaller today. 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 08/20/17 on evaluation today patient appears to be doing very well in regard to his left lateral foot ulcer. He has been tolerating the dressing changes without complication the Prisma does seem to be helping. With that being said he does still continue to have pain mainly in the posterior wound the more anterior wound is not really hurting him. Fortunately there is no evidence of infection showing up at this point. READMISSION 12/08/17 Is a patient that we had in clinic for a few visits in May of this year. 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 He tells me he was seen by her rheumatologist while he ESMOND, HINCH ([id number])  [phone].pdf Page 5 of 7 was incarcerated at [facility] several years ago we made this diagnosis. I don't have any of this information available. By talking to the patient he does clearly have Raynaud's phenomenon very cold sensitive he has had wounds in his hands and noted discoloration. On this occasion he developed a wound on his right lateral foot in June this started as a purplish discoloration and then expanded. It is very painful he is been using peroxide and a bandage. He developed another wound on the left medial malleolus 2-3 weeks ago. He is in the ER on July 23 and was given a prescription for doxycycline . He may have received additional antibiotics from his primary doctor nobody is really sure about this. He has a history of gout, Raynaud's and connective tissue diseaseo Overlap syndrome. He has an ABI on the right of 1.09 and on the left at 1.18. 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 On the right lateral foot he has 2 open areas. The more proximal one has depth and perhaps a small superficial tendon at the base. The distal one has no depth and the surface looks quite good. Left lateral malleolus measuring smaller. We've been using silver  collagen 12/29/17; not much improvement. He has 3 open areas on the right lateral foot with close proximity to each other. The more proximal one as some depth the others are superficial. The area over the left medial ankle/medial malleolus has some fixed skin around the circumference. We've been using silver  collagen however I changed him to and alginate today 01/06/2018; some improvement in wound area especially on the right lateral foot. The wounds are 3 small areas that have contracted. The area on the left medial malleolus also looks smaller 01/30/2018; he has areas on the right lateral foot and left medial ankle. Both of these look  better than the last time. We only have one area on the right however it still has some depth. The area on the left medial ankle is superficial and appears to have healthy tissue. We have been using silver  alginate READMISSION 04/08/2023 This is a patient we had in the clinic in 2019 on 2 separate occasions. I think when he was here last I looked after him. He had wounds on the right lateral foot and left medial ankle. We simply cleaned out the wounds dressed him and put him in compression and these healed on both occasions fairly quickly. He says that nothing really happened until about 2-1/2 months ago he had an opening on the right medial lower leg just above the ankle which was exceptionally painful. 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 He has been putting Silvadene  cream on this. He had a course of doxycycline  neither 1 of these really helped. He finds this wound painful. Past medical history the patient is a prediabetic with a recent hemoglobin A1c of 6. He has longstanding Raynaud's phenomenon. At some points in his past he has been queried to have some form of connective tissue disease. I did not see any rheumatologic  tests in epic although I did find a normal ESR and C- reactive protein from 2012. He is a non-smoker ABI in our clinic was 1 on the right and 1.14 on the left 04/22/2023; patient presents for follow-up. We have been using PolyMem under compression wrap to the right lower extremity wound. Wound is smaller today. He has no issues or complaints today. He denies signs of infection. Objective Constitutional respirations regular, non-labored and within target range for patient.. Vitals Time Taken: 9:26 AM, Height: 75 in, Weight: 290 lbs, BMI: 36.2, Temperature: 98.2 F, Pulse: 73 bpm, Respiratory Rate: 17 breaths/min, Blood Pressure: 165/80 mmHg. Cardiovascular 2+ dorsalis pedis/posterior tibialis  pulses. Psychiatric pleasant and cooperative. General Notes: T the right medial lower leg above the ankle there is a small open wound with granulation tissue at the opening. No surrounding signs of o infection including increased warmth, erythema or purulent drainage. Nonpitting edema. Integumentary (Hair, Skin) Wound #4 status is Open. Original cause of wound was Not Known. The date acquired was: 01/29/2023. The wound has been in treatment 2 weeks. The wound is located on the Right,Medial Lower Leg. The wound measures 0.5cm length x 0.3cm width x 0.1cm depth; 0.118cm^2 area and 0.012cm^3 volume. There is Fat Layer (Subcutaneous Tissue) exposed. There is no tunneling or undermining noted. There is a medium amount of serosanguineous drainage noted. The wound margin is distinct with the outline attached to the wound base. There is medium (34-66%) red, pink granulation within the wound bed. There is a medium (34-66%) amount of necrotic tissue within the wound bed including Eschar and Adherent Slough. The periwound skin appearance did not exhibit: Callus, Crepitus, Excoriation, Induration, Rash, Scarring, Dry/Scaly, Maceration, Atrophie Blanche, Cyanosis, Ecchymosis, Hemosiderin Staining, Mottled, Pallor, Rubor, Erythema. Periwound temperature was noted as No Abnormality. The periwound has tenderness on palpation. Assessment Active Problems ICD-10 Chronic venous hypertension (idiopathic) with ulcer and inflammation of right lower extremity Non-pressure chronic ulcer of other part of right lower leg with other specified severity Raynaud's syndrome without gangrene GEOVANIE, WINNETT ([id number]) [phone].pdf Page 6 of 7 Patient's wound has shown improvement in size) since last clinic visit. I recommended continuing the course with PolyMem silver  and I will add antibiotic ointment to address any bioburden. Continue 4-layer compression. Follow-up in 1 week. Procedures Wound  #4 Pre-procedure diagnosis of Wound #4 is a Venous Leg Ulcer located on the Right,Medial Lower Leg . There was a Four Layer Compression Therapy Procedure by Zochol, Jamie, RN. Post procedure Diagnosis Wound #4: Same as Pre-Procedure Notes: urgo regular. Plan Follow-up Appointments: Return Appointment in 2 weeks. - ***Dr. Rosan**** (front office to schedule) Nurse Visit: - 0845 Tuesday 04/15/2023 nurse visit (front office to schedule) Other: - Ve[facility] and V[facility] will call you to schedule the tests. The morning of the test remove the compression wrap, cover wound the bandage, and go to have the tests performed. Anesthetic: (In clinic) Topical Lidocaine 4% applied to wound bed Bathing/ Shower/ Hygiene: May shower with protection but do not get wound dressing(s) wet. Protect dressing(s) with water repellant cover (for example, large plastic bag) or a cast cover and may then take shower. Edema Control - Orders / Instructions: Elevate legs to the level of the heart or above for 30 minutes daily and/or when sitting for 3-4 times a day throughout the day. Avoid standing for long periods of time. Patient to wear own compression stockings every day. - to left leg apply in the morning and remove at night. purchase compression stockings tightness  20-30 mm/Hg pressure. Exercise regularly Moisturize legs daily. - left leg WOUND #4: - Lower Leg Wound Laterality: Right, Medial Cleanser: Soap and Water 1 x Per Week/30 Days Discharge Instructions: May shower and wash wound with dial antibacterial soap and water prior to dressing change. Cleanser: Vashe 5.8 (oz) 1 x Per Week/30 Days Discharge Instructions: Cleanse the wound with Vashe prior to applying a clean dressing using gauze sponges, not tissue or cotton balls. Peri-Wound Care: Triamcinolone 15 (g) 1 x Per Week/30 Days Discharge Instructions: ***Use triamcinolone liberally**** Peri-Wound Care: Sween Lotion (Moisturizing lotion) 1 x Per Week/30  Days Discharge Instructions: Apply moisturizing lotion as directed Topical: Gentamicin 1 x Per Week/30 Days Discharge Instructions: As directed by physician Topical: Mupirocin  Ointment 1 x Per Week/30 Days Discharge Instructions: Apply Mupirocin  (Bactroban ) as instructed Prim Dressing: PolyMem Silver  Non-Adhesive Dressing, 4.25x4.25 in 1 x Per Week/30 Days ary Discharge Instructions: Apply to wound bed as instructed Secondary Dressing: ABD Pad, 5x9 1 x Per Week/30 Days Discharge Instructions: Apply over primary dressing as directed. Com pression Wrap: Urgo K2, (equivalent to a 4 layer) two layer compression system, regular 1 x Per Week/30 Days Discharge Instructions: Apply Urgo K2 as directed (alternative to 4 layer compression). 1. PolyMem silver  2. Antibiotic ointment 3. 4-layer compression to the right lower extremity 4. Follow-up in 1 week Electronic Signature(s) Signed: 04/23/2023 5:26:35 PM By: Rosan Raisin DO Entered By: Rosan Raisin on 04/22/2023 10:00:57 -------------------------------------------------------------------------------- SuperBill Details Patient Name: Date of Service: Robert MALVA EVERITT MILUS MES R. 04/22/2023 Medical Record Number: [id number] Patient Account Number: [id number] Date of Birth/Sex: Treating RN: [birth year] (42 y.o. M) Primary Care Provider: Almarie Birmingham Other Clinician: Referring Provider: Treating Provider/Extender: Rosan Raisin Almarie Birmingham Devra in Treatment: [address] [place], MCCLURE ([id number]) [id number] Page 7 of 7 Diagnosis Coding ICD-10 Codes Code Description I87.331 Chronic venous hypertension (idiopathic) with ulcer and inflammation of right lower extremity L97.818 Non-pressure chronic ulcer of other part of right lower leg with other specified severity I73.00 Raynaud's syndrome without gangrene Physician Procedures : CPT4 Code Description Modifier [id number] - WC PHYS LEVEL 3 - EST PT ICD-10 Diagnosis Description  I87.331 Chronic venous hypertension (idiopathic) with ulcer and inflammation of right lower extremity L97.818 Non-pressure chronic ulcer of other part  of right lower leg with other specified severity Quantity: 1 Electronic Signature(s) Signed: 04/23/2023 5:26:35 PM By: Rosan Raisin DO Entered By: Rosan Raisin on 04/22/2023 10:01:44

## 2023-04-29 ENCOUNTER — Ambulatory Visit (INDEPENDENT_AMBULATORY_CARE_PROVIDER_SITE_OTHER): Payer: BC Managed Care – PPO | Admitting: Allergy and Immunology

## 2023-04-29 ENCOUNTER — Telehealth: Payer: Self-pay

## 2023-04-29 ENCOUNTER — Encounter: Payer: Self-pay | Admitting: Allergy and Immunology

## 2023-04-29 VITALS — BP 122/66 | HR 75 | Temp 98.0°F | Resp 16 | Ht 74.21 in | Wt 299.3 lb

## 2023-04-29 DIAGNOSIS — T7800XA Anaphylactic reaction due to unspecified food, initial encounter: Secondary | ICD-10-CM

## 2023-04-29 DIAGNOSIS — T7800XD Anaphylactic reaction due to unspecified food, subsequent encounter: Secondary | ICD-10-CM | POA: Diagnosis not present

## 2023-04-29 DIAGNOSIS — T63481D Toxic effect of venom of other arthropod, accidental (unintentional), subsequent encounter: Secondary | ICD-10-CM | POA: Diagnosis not present

## 2023-04-29 MED ORDER — EPINEPHRINE 0.3 MG/0.3ML IJ SOAJ: 0.3000 mg | INTRAMUSCULAR | 1 refills | Status: AC | PRN

## 2023-04-29 NOTE — Progress Notes (Signed)
 [place] - [place]   Dear Waddell Mon,  Thank you for referring [name] to the [facility] [place]  on 04/29/2023.   Below is a summation of this patient's evaluation and recommendations.  Thank you for your referral. I will keep you informed about this patient's response to treatment.   If you have any questions please do not hesitate to contact me.   Sincerely,  Camellia DOROTHA Denis, MD Allergy  / Immunology [place] [facility] [place]    ______________________________________________________________________    NEW PATIENT NOTE  Referring Provider: Mon Waddell NOVAK, NP Primary Provider: Mon Waddell NOVAK, NP Date of office visit: 04/29/2023    Subjective:   Chief Complaint:  [name] (DOB: 02-12-1981) is a 43 y.o. male who presents to the clinic on 04/29/2023 with a chief complaint of Allergic Reaction (Says he ate dinner and woke up itching all over and facial and lip swelling. Has pictures of his face from the reaction. Says he thinks it was the sauce from the shrimp. ) .     HPI: [name] presents to this clinic in evaluation of 2 main issues.  First, he had an allergic reaction on Thanksgiving 2024.  At around 8:30 at night he ate shrimp and pinto beans along with various sauces.  At around 1045 he woke up with eye itching and rubbing and global urticaria and subsequently developed very significant eye swelling and facial swelling and nausea requiring him to go to the emergency room for which he was treated with a shot.  Since that point in time he has not consumed any shellfish.  He did not consume any nonsteroidal anti-inflammatory drugs prior to that reaction.  Second, he had a rather significant allergic reaction following hymenoptera venom exposure.  He had 4 stings this summer and he developed rather significant swelling at each 1 of those sting sites.  Then several  weeks later he had 4 additional stings and in this case he developed global itchiness and hives and neck swelling.  He went to the urgent care center and he was treated with a shot.  He has not been stung since.  Past Medical History:  Diagnosis Date   Arthritis    Gout    NGU (nongonococcal urethritis)    Raynaud's disease    Skin infection     No past surgical history on file.  Allergies as of 04/29/2023       Reactions   Other Diarrhea, Nausea And Vomiting        Medication List    EPINEPHrine  0.3 mg/0.3 mL Soaj injection Commonly known as: EPI-PEN Inject 0.3 mg into the muscle as needed for anaphylaxis.    Review of systems negative except as noted in HPI / PMHx or noted below:  Review of Systems  Constitutional: Negative.   HENT: Negative.    Eyes: Negative.   Respiratory: Negative.    Cardiovascular: Negative.   Gastrointestinal: Negative.   Genitourinary: Negative.   Musculoskeletal: Negative.   Skin: Negative.   Neurological: Negative.   Endo/Heme/Allergies: Negative.   Psychiatric/Behavioral: Negative.      Family History  Problem Relation Age of Onset   Arthritis Mother    Allergic rhinitis Father    Arthritis Father    Diabetes Father    Lupus Sister    Diabetes Maternal Grandmother    Diabetes Paternal Grandmother     Social History  Socioeconomic History   Marital status: Significant Other    Spouse name: Not on file   Number of children: Not on file   Years of education: Not on file   Highest education level: Not on file  Occupational History   Not on file  Tobacco Use   Smoking status: Former    Types: Cigarettes    Start date: 2004    Passive exposure: Never   Smokeless tobacco: Never  Vaping Use   Vaping status: Never Used  Substance and Sexual Activity   Alcohol use: Yes    Alcohol/week: 2.0 standard drinks of alcohol    Types: 2 Cans of beer per week    Comment: rarely   Drug use: Not Currently    Frequency: 3.0 times  per week    Types: Marijuana   Sexual activity: Yes    Partners: Female    Birth control/protection: Condom  Other Topics Concern   Not on file  Social History Narrative   Not on file   Environmental and Social history  Lives in a house with a dry environment, no animals located inside the household, no carpet in the bedroom, no plastic on the bed, no plastic on the pillow, no smoking ongoing inside the household.  He is a naval architect.  Objective:   Vitals:   04/29/23 1447  BP: 122/66  Pulse: 75  Resp: 16  Temp: 98 F (36.7 C)  SpO2: 99%   Height: 6' 2.21 (188.5 cm) Weight: 299 lb 4.8 oz (135.8 kg)  Physical Exam Constitutional:      Appearance: He is not diaphoretic.  HENT:     Head: Normocephalic.     Right Ear: Tympanic membrane, ear canal and external ear normal.     Left Ear: Tympanic membrane, ear canal and external ear normal.     Nose: Nose normal. No mucosal edema or rhinorrhea.     Mouth/Throat:     Pharynx: Uvula midline. No oropharyngeal exudate.  Eyes:     Conjunctiva/sclera: Conjunctivae normal.  Neck:     Thyroid : No thyromegaly.     Trachea: Trachea normal. No tracheal tenderness or tracheal deviation.  Cardiovascular:     Rate and Rhythm: Normal rate and regular rhythm.     Heart sounds: Normal heart sounds, S1 normal and S2 normal. No murmur heard. Pulmonary:     Effort: No respiratory distress.     Breath sounds: Normal breath sounds. No stridor. No wheezing or rales.  Lymphadenopathy:     Head:     Right side of head: No tonsillar adenopathy.     Left side of head: No tonsillar adenopathy.     Cervical: No cervical adenopathy.  Skin:    Findings: No erythema or rash.     Nails: There is no clubbing.  Neurological:     Mental Status: He is alert.     Diagnostics: Allergy  skin tests were not performed.   Assessment and Plan:    1. Allergy  with anaphylaxis due to food   2. Anaphylaxis due to hymenoptera venom, accidental or  unintentional, subsequent encounter     1. Allergen avoidance measures - insect venom, shellfish  2. Blood - venom IgE panel, shellfish panel, alpha-gal panel  3. If allergic reaction:   A. Epi-Pen, benadryl , MD/ER evaluation  4. Further evaluation??? Yes, if recurrent reactions or negative shellfish test  Jden has a history consistent with anaphylaxis as a result of venom exposure and possibly shellfish exposure and  we will work through this issue with the blood test noted above.  If he is negative on either 1 of these test then we need to have him further evaluated for other etiologic factors contributing to his episodes of anaphylaxis.  Within his venom panel will be a screening tryptase level to examine for dysfunctional mast cell regulation.  Will make a determination about further evaluation and treatment based upon these blood test results.  If he is positive for venom allergy  IgE we would recommend that he undergo a course of immunotherapy given the multiorgan allergic disease he sustained following exposure to venom.  Camellia DOROTHA Denis, MD Allergy  / Immunology [place] [facility]  

## 2023-04-29 NOTE — Patient Instructions (Signed)
  1. Allergen avoidance measures - insect venom, shellfish  2. Blood - venom IgE panel, shellfish panel, alpha-gal panel  3. If allergic reaction:   A. Epi-Pen, benadryl , MD/ER evaluation  4. Further evaluation??? Yes, if recurrent reactions or negative shellfish test

## 2023-04-29 NOTE — Telephone Encounter (Signed)
 Error

## 2023-04-30 ENCOUNTER — Telehealth: Payer: Self-pay

## 2023-04-30 ENCOUNTER — Other Ambulatory Visit (HOSPITAL_COMMUNITY): Payer: Self-pay

## 2023-04-30 ENCOUNTER — Encounter: Payer: Self-pay | Admitting: Allergy and Immunology

## 2023-04-30 NOTE — Telephone Encounter (Signed)
 Pharmacy Patient Advocate Encounter   Received notification from Fax that prior authorization for Epinephrine  0.3mg  is required/requested.   Insurance verification completed.   The patient is insured through [employer] [facility] .   Per test claim: The current 30 day co-pay is, $5.00.  No PA needed at this time. This test claim was processed through [facility]- copay amounts may vary at other pharmacies due to pharmacy/plan contracts, or as the patient moves through the different stages of their insurance plan.     Called patient pharmacy and notified of PA not needed-insurance will only pay for specific manufactures

## 2023-05-02 LAB — HYMENOPTERA VENOM ALLERGY PROF

## 2023-05-03 LAB — ALPHA-GAL PANEL
Allergen Lamb IgE: <0.1 kU/L
Beef IgE: <0.1 kU/L
IgE (Immunoglobulin E), Serum: 287 [IU]/mL (ref 6–495)
O215-IgE Alpha-Gal: <0.1 kU/L
Pork IgE: <0.1 kU/L

## 2023-05-03 LAB — ALLERGEN PROFILE, SHELLFISH
Clam IgE: 0.26 kU/L — AB
F023-IgE Crab: 3.93 kU/L — AB
F080-IgE Lobster: 3.49 kU/L — AB
F290-IgE Oyster: 0.33 kU/L — AB
Scallop IgE: 0.25 kU/L — AB
Shrimp IgE: 13.2 kU/L — AB

## 2023-05-03 LAB — HYMENOPTERA VENOM ALLERGY PROF
I001-IgE Honeybee: 1.22 kU/L — AB
I209-IgE Ves v 5: 6.93 kU/L — AB
I210-IgE Pol d 5: 3.94 kU/L — AB
I211-IgE Ves v 1: 0.13 kU/L — AB
I211-IgE Ves v 1: 21 kU/L — AB
O214-IgE CCD Determinants: 22.2 kU/L — AB
Tryptase: 4.9 ug/L (ref 2.2–13.2)

## 2023-05-03 LAB — ALLERGEN COMPONENT COMMENTS

## 2023-05-03 LAB — O214-IGE CCD DETERMINANTS: O214-IgE CCD Determinants: 0.19 kU/L — AB

## 2023-05-06 ENCOUNTER — Ambulatory Visit (HOSPITAL_BASED_OUTPATIENT_CLINIC_OR_DEPARTMENT_OTHER): Payer: BC Managed Care – PPO | Admitting: Internal Medicine

## 2023-05-08 ENCOUNTER — Encounter (HOSPITAL_BASED_OUTPATIENT_CLINIC_OR_DEPARTMENT_OTHER): Payer: BC Managed Care – PPO | Admitting: Internal Medicine

## 2023-05-08 DIAGNOSIS — I87333 Chronic venous hypertension (idiopathic) with ulcer and inflammation of bilateral lower extremity: Secondary | ICD-10-CM

## 2023-05-08 DIAGNOSIS — L97822 Non-pressure chronic ulcer of other part of left lower leg with fat layer exposed: Secondary | ICD-10-CM

## 2023-05-08 DIAGNOSIS — I87332 Chronic venous hypertension (idiopathic) with ulcer and inflammation of left lower extremity: Secondary | ICD-10-CM

## 2023-05-08 DIAGNOSIS — L97818 Non-pressure chronic ulcer of other part of right lower leg with other specified severity: Secondary | ICD-10-CM

## 2023-05-08 DIAGNOSIS — I87331 Chronic venous hypertension (idiopathic) with ulcer and inflammation of right lower extremity: Secondary | ICD-10-CM

## 2023-05-15 ENCOUNTER — Ambulatory Visit (HOSPITAL_BASED_OUTPATIENT_CLINIC_OR_DEPARTMENT_OTHER): Payer: BC Managed Care – PPO | Admitting: Internal Medicine

## 2023-05-16 ENCOUNTER — Encounter (HOSPITAL_BASED_OUTPATIENT_CLINIC_OR_DEPARTMENT_OTHER): Payer: BC Managed Care – PPO | Admitting: General Surgery

## 2023-05-16 DIAGNOSIS — I87331 Chronic venous hypertension (idiopathic) with ulcer and inflammation of right lower extremity: Secondary | ICD-10-CM | POA: Diagnosis not present

## 2023-05-21 ENCOUNTER — Other Ambulatory Visit (HOSPITAL_COMMUNITY): Payer: Self-pay | Admitting: Internal Medicine

## 2023-05-21 ENCOUNTER — Encounter (HOSPITAL_COMMUNITY): Payer: Self-pay

## 2023-05-21 ENCOUNTER — Ambulatory Visit (HOSPITAL_COMMUNITY): Admission: RE | Admit: 2023-05-21 | Payer: BC Managed Care – PPO | Source: Ambulatory Visit

## 2023-05-21 ENCOUNTER — Encounter (HOSPITAL_BASED_OUTPATIENT_CLINIC_OR_DEPARTMENT_OTHER): Payer: BC Managed Care – PPO | Attending: Internal Medicine | Admitting: Internal Medicine

## 2023-05-21 DIAGNOSIS — S91302A Unspecified open wound, left foot, initial encounter: Secondary | ICD-10-CM

## 2023-05-21 DIAGNOSIS — I7300 Raynaud's syndrome without gangrene: Secondary | ICD-10-CM | POA: Diagnosis not present

## 2023-05-21 DIAGNOSIS — L97818 Non-pressure chronic ulcer of other part of right lower leg with other specified severity: Secondary | ICD-10-CM | POA: Insufficient documentation

## 2023-05-21 DIAGNOSIS — T8189XS Other complications of procedures, not elsewhere classified, sequela: Secondary | ICD-10-CM

## 2023-05-21 DIAGNOSIS — I87331 Chronic venous hypertension (idiopathic) with ulcer and inflammation of right lower extremity: Secondary | ICD-10-CM | POA: Diagnosis present

## 2023-05-21 DIAGNOSIS — I87333 Chronic venous hypertension (idiopathic) with ulcer and inflammation of bilateral lower extremity: Secondary | ICD-10-CM | POA: Diagnosis not present

## 2023-05-21 DIAGNOSIS — L97822 Non-pressure chronic ulcer of other part of left lower leg with fat layer exposed: Secondary | ICD-10-CM | POA: Insufficient documentation

## 2023-05-21 DIAGNOSIS — I87332 Chronic venous hypertension (idiopathic) with ulcer and inflammation of left lower extremity: Secondary | ICD-10-CM | POA: Diagnosis not present

## 2023-05-21 DIAGNOSIS — R600 Localized edema: Secondary | ICD-10-CM

## 2023-05-21 DIAGNOSIS — Z09 Encounter for follow-up examination after completed treatment for conditions other than malignant neoplasm: Secondary | ICD-10-CM | POA: Diagnosis not present

## 2023-05-26 ENCOUNTER — Ambulatory Visit (HOSPITAL_BASED_OUTPATIENT_CLINIC_OR_DEPARTMENT_OTHER): Payer: BC Managed Care – PPO | Admitting: Internal Medicine

## 2023-05-28 ENCOUNTER — Encounter (HOSPITAL_COMMUNITY): Payer: Self-pay

## 2023-05-28 ENCOUNTER — Ambulatory Visit (INDEPENDENT_AMBULATORY_CARE_PROVIDER_SITE_OTHER)
Admission: RE | Admit: 2023-05-28 | Discharge: 2023-05-28 | Disposition: A | Discharge status: Home / Self Care | Payer: BC Managed Care – PPO | Source: Ambulatory Visit | Attending: Vascular Surgery | Admitting: Vascular Surgery

## 2023-05-28 ENCOUNTER — Ambulatory Visit (HOSPITAL_COMMUNITY)
Admission: RE | Admit: 2023-05-28 | Discharge: 2023-05-28 | Disposition: A | Discharge status: Home / Self Care | Payer: BC Managed Care – PPO | Source: Ambulatory Visit | Attending: Vascular Surgery | Admitting: Vascular Surgery

## 2023-05-28 DIAGNOSIS — S91302A Unspecified open wound, left foot, initial encounter: Secondary | ICD-10-CM | POA: Diagnosis not present

## 2023-05-28 DIAGNOSIS — T8189XS Other complications of procedures, not elsewhere classified, sequela: Secondary | ICD-10-CM | POA: Diagnosis present

## 2023-05-28 DIAGNOSIS — R600 Localized edema: Secondary | ICD-10-CM | POA: Insufficient documentation

## 2023-05-28 LAB — VAS US ABI WITH/WO TBI
Left ABI: 10.9
Right ABI: 1.07

## 2023-05-29 ENCOUNTER — Other Ambulatory Visit (HOSPITAL_COMMUNITY): Payer: Self-pay | Admitting: Internal Medicine

## 2023-05-29 DIAGNOSIS — L97818 Non-pressure chronic ulcer of other part of right lower leg with other specified severity: Secondary | ICD-10-CM

## 2023-05-30 ENCOUNTER — Encounter (HOSPITAL_COMMUNITY): Payer: BC Managed Care – PPO

## 2023-05-30 ENCOUNTER — Ambulatory Visit (HOSPITAL_COMMUNITY)
Admission: RE | Admit: 2023-05-30 | Discharge: 2023-05-30 | Disposition: A | Discharge status: Home / Self Care | Payer: BC Managed Care – PPO | Source: Ambulatory Visit | Attending: Vascular Surgery | Admitting: Vascular Surgery

## 2023-05-30 ENCOUNTER — Encounter (HOSPITAL_COMMUNITY): Payer: Self-pay

## 2023-05-30 DIAGNOSIS — L97818 Non-pressure chronic ulcer of other part of right lower leg with other specified severity: Secondary | ICD-10-CM | POA: Insufficient documentation

## 2023-06-05 ENCOUNTER — Encounter (HOSPITAL_BASED_OUTPATIENT_CLINIC_OR_DEPARTMENT_OTHER): Payer: BC Managed Care – PPO | Admitting: Internal Medicine

## 2023-06-05 DIAGNOSIS — L97822 Non-pressure chronic ulcer of other part of left lower leg with fat layer exposed: Secondary | ICD-10-CM

## 2023-06-05 DIAGNOSIS — I87332 Chronic venous hypertension (idiopathic) with ulcer and inflammation of left lower extremity: Secondary | ICD-10-CM

## 2023-06-05 DIAGNOSIS — Z09 Encounter for follow-up examination after completed treatment for conditions other than malignant neoplasm: Secondary | ICD-10-CM | POA: Diagnosis not present

## 2023-06-09 ENCOUNTER — Ambulatory Visit (INDEPENDENT_AMBULATORY_CARE_PROVIDER_SITE_OTHER): Payer: BC Managed Care – PPO | Admitting: Physician Assistant

## 2023-06-09 VITALS — BP 146/85 | HR 79 | Temp 98.3°F | Resp 20 | Ht 74.2 in | Wt 302.5 lb

## 2023-06-09 DIAGNOSIS — M7989 Other specified soft tissue disorders: Secondary | ICD-10-CM

## 2023-06-09 DIAGNOSIS — I872 Venous insufficiency (chronic) (peripheral): Secondary | ICD-10-CM

## 2023-06-09 NOTE — Progress Notes (Signed)
 [facility] [place]     History of Present Illness  [name] is a 43 y.o. male who presents with chief complaint: swollen leg.  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    The patient has had no history of DVT, no history of varicose vein, positive history of venous stasis ulcers for > 4 years, no history of  Lymphedema and positive darkening skin color history of skin changes in lower legs.  There is unknown family history of venous disorders.  The patient has been measured for compression stockings and is in the process of ordering them.     He is followed by wound care for chronic medial malleolus wound with edema.  He states the first time he developed medial ankle ulcers was a little over 4 years ago.  He has skin changes at the level of the ankles and into the feet.  He denies claudication and rest pain.        Past Medical History:  Diagnosis Date   Arthritis    Gout    NGU (nongonococcal urethritis)    Raynaud's disease    Skin infection     History reviewed. No pertinent surgical history.  Social History   Socioeconomic History   Marital status: Significant Other    Spouse name: Not on file   Number of children: Not on file   Years of education: Not on file   Highest education level: Not on file  Occupational History   Not on file  Tobacco Use   Smoking status: Former    Types: Cigarettes    Start date: 2004    Passive exposure: Never   Smokeless tobacco: Never  Vaping Use   Vaping status: Never Used  Substance and Sexual Activity   Alcohol use: Yes    Alcohol/week: 2.0 standard drinks of alcohol    Types: 2 Cans of beer per week    Comment: rarely   Drug use: Not Currently    Frequency: 3.0 times per week    Types: Marijuana   Sexual activity: Yes    Partners: Female    Birth control/protection: Condom   Other Topics Concern   Not on file  Social History Narrative   Not on file   Social Drivers of Health   Financial Resource Strain: Not on file  Food Insecurity: Not on file  Transportation Needs: Not on file  Physical Activity: Not on file  Stress: Not on file  Social Connections: Not on file  Intimate Partner Violence: Not on file    Family History  Problem Relation Age of Onset   Arthritis Mother    Allergic rhinitis Father    Arthritis Father    Diabetes Father    Lupus Sister    Diabetes Maternal Grandmother    Diabetes Paternal Grandmother     Current Outpatient Medications on File Prior to Visit  Medication Sig Dispense Refill   EPINEPHrine 0.3 mg/0.3 mL IJ SOAJ injection Inject 0.3 mg into the muscle as needed for anaphylaxis. 1 each 1   No current facility-administered medications on file prior to visit.    Allergies as of 06/09/2023 - Review Complete 06/09/2023  Allergen Reaction Noted   Other Diarrhea and Nausea And Vomiting 07/04/2020     ROS:   General:  No weight loss, Fever, chills  HEENT: No recent headaches, no nasal bleeding, no visual changes, no sore throat  Neurologic: No dizziness, blackouts, seizures. No recent symptoms of stroke or mini- stroke. No recent episodes of slurred speech, or temporary blindness.  Cardiac: No recent episodes of chest pain/pressure, no shortness of breath at rest.  No shortness of breath with exertion.  Denies history of atrial fibrillation or irregular heartbeat  Vascular: No history of rest pain in feet.  No history of claudication.   history of non-healing venous ulcer,s No history of DVT   Pulmonary: No home oxygen, no productive cough, no hemoptysis,  No asthma or wheezing  Musculoskeletal:  [ ]  Arthritis, [ ]  Low back pain,  [ ]  Joint pain  Hematologic:No history of hypercoagulable state.  No history of easy bleeding.  No history of anemia  Gastrointestinal: No hematochezia or melena,  No  gastroesophageal reflux, no trouble swallowing  Urinary: [ ]  chronic Kidney disease, [ ]  on HD - [ ]  MWF or [ ]  TTHS, [ ]  Burning with urination, [ ]  Frequent urination, [ ]  Difficulty urinating;   Skin: No rashes  Psychological: No history of anxiety,  No history of depression  Physical Examination  There were no vitals filed for this visit.  There is no height or weight on file to calculate BMI.  General:  Alert and oriented, no acute distress HEENT: Normal Neck: No bruit or JVD Pulmonary: Clear to auscultation bilaterally Cardiac: Regular Rate and Rhythm without murmur Abdomen: Soft, non-tender, non-distended, no mass, no scars Skin: No rash Extremity Pulses:  2+ radial,  femoral, dorsalis pedis, posterior tibial pulses bilaterally Musculoskeletal: minimal edema B LE  Neurologic: Upper and lower extremity motor 5/5 and symmetric  DATA: Venous Reflux Times  +--------------+---------+------+-----------+------------+-------------+  RIGHT        Reflux NoRefluxReflux TimeDiameter cmsComments                               Yes                                        +--------------+---------+------+-----------+------------+-------------+  CFV                    yes   >1 second                            +--------------+---------+------+-----------+------------+-------------+  FV prox                 yes   >1 second                            +--------------+---------+------+-----------+------------+-------------+  FV mid                  yes   >1 second                            +--------------+---------+------+-----------+------------+-------------+  FV dist                 yes   >1 second                            +--------------+---------+------+-----------+------------+-------------+  Popliteal  yes   >1 second                            +--------------+---------+------+-----------+------------+-------------+   GSV at [facility]              yes    >500 ms     0.529                   +--------------+---------+------+-----------+------------+-------------+  GSV prox thighno                           0.365                   +--------------+---------+------+-----------+------------+-------------+  GSV mid thigh no                            0.31                   +--------------+---------+------+-----------+------------+-------------+  GSV dist thighno                           0.316                   +--------------+---------+------+-----------+------------+-------------+  GSV at knee             yes    >500 ms     0.365    out of fascia  +--------------+---------+------+-----------+------------+-------------+  GSV prox calf           yes    >500 ms     0.554    out of fascia  +--------------+---------+------+-----------+------------+-------------+  SSV Pop Fossa no                           0.255                   +--------------+---------+------+-----------+------------+-------------+  SSV prox calf no                           0.281                   +--------------+---------+------+-----------+------------+-------------+  SSV mid calf  no                           0.313                   +--------------+---------+------+-----------+------------+-------------+     +--------------+---------+------+-----------+------------+--------+  LEFT         Reflux NoRefluxReflux TimeDiameter cmsComments                          Yes                                   +--------------+---------+------+-----------+------------+--------+  CFV                    yes   >1 second                       +--------------+---------+------+-----------+------------+--------+  FV prox  yes   >1 second                       +--------------+---------+------+-----------+------------+--------+  FV mid                  yes    >1 second                       +--------------+---------+------+-----------+------------+--------+  FV dist                 yes   >1 second                       +--------------+---------+------+-----------+------------+--------+  Popliteal              yes   >1 second                       +--------------+---------+------+-----------+------------+--------+  GSV at SFJ              yes    >500 ms     0.789              +--------------+---------+------+-----------+------------+--------+  GSV prox thighno                           0.444              +--------------+---------+------+-----------+------------+--------+  GSV mid thigh no                            0.31              +--------------+---------+------+-----------+------------+--------+  GSV dist thigh          yes    >500 ms     0.406              +--------------+---------+------+-----------+------------+--------+  GSV prox calf           yes    >500 ms     0.447              +--------------+---------+------+-----------+------------+--------+  SSV Pop Fossa no                            0.31              +--------------+---------+------+-----------+------------+--------+  SSV prox calf           yes    >500 ms      0.31              +--------------+---------+------+-----------+------------+--------+  SSV mid calf  no                           0.269              +--------------+---------+------+-----------+------------+--------+  AASV mid thigh          yes    >500 ms     0.297              +--------------+---------+------+-----------+------------+--------+    Summary:  Right:  - No evidence of deep vein thrombosis seen in the right lower extremity,  from the common femoral through the popliteal veins.  - No evidence of superficial venous thrombosis in the right lower  extremity.   - Deep vein reflux in  the CFV, FV, and popliteal vein.   -  Superficial vein reflux in the SFJ, GSV at the knee and proximal calf.    Left:  - No evidence of deep vein thrombosis seen in the left lower extremity,  from the common femoral through the popliteal veins.  - No evidence of superficial venous thrombosis in the left lower  extremity.   - Deep vein reflux in the CFV, FV, and popliteal vein.   - Superficial vein reflux in the SSV proximal calf; the SFJ, and GSV at  distal thigh and proximal calf; and the AASV mid thigh.       Assessment/Plan: Positive venous reflux throughout the deep system with B SFJ reflux and enlarged GSV > 0.4 cm.  He has chronic wounds that are being followed at the [facility] wound center.   The SFJ B and GSV enlargement with other areas of reflux in the GSV.  Right.  I will start him with conservative treatment.  Elevation when at rest, compression daily, and exercise.  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    His vein size and symptoms with SFJ reflux qualify him to see our providers in the vein clinic to discuss potential benefits for laser ablation therapy.  He will f/u in the vein clinic in 3-5 months.    Mosetta Pigeon PA-C [facility] [place] Office: [phone]  MD in clinic [place]

## 2023-07-31 ENCOUNTER — Ambulatory Visit: Admitting: Family Medicine

## 2023-08-04 ENCOUNTER — Telehealth: Payer: Self-pay | Admitting: Family Medicine

## 2023-08-04 ENCOUNTER — Encounter: Payer: Self-pay | Admitting: Family Medicine

## 2023-08-04 ENCOUNTER — Ambulatory Visit: Admitting: Family Medicine

## 2023-08-04 VITALS — BP 130/70 | HR 69 | Ht 74.0 in | Wt 298.0 lb

## 2023-08-04 DIAGNOSIS — M25531 Pain in right wrist: Secondary | ICD-10-CM | POA: Diagnosis not present

## 2023-08-04 DIAGNOSIS — R7303 Prediabetes: Secondary | ICD-10-CM

## 2023-08-04 DIAGNOSIS — G8929 Other chronic pain: Secondary | ICD-10-CM

## 2023-08-04 DIAGNOSIS — M545 Low back pain, unspecified: Secondary | ICD-10-CM | POA: Diagnosis not present

## 2023-08-04 DIAGNOSIS — R748 Abnormal levels of other serum enzymes: Secondary | ICD-10-CM | POA: Diagnosis not present

## 2023-08-04 MED ORDER — MELOXICAM 15 MG PO TABS: 15.0000 mg | ORAL_TABLET | Freq: Every day | ORAL | 0 refills | Status: DC

## 2023-08-04 NOTE — Telephone Encounter (Signed)
**Note De-identified  [place] Obfuscation** Please advise 

## 2023-08-04 NOTE — Progress Notes (Signed)
 Acute Office Visit  Subjective:     Patient ID: [name], male    DOB: November 11, 1980, 43 y.o.   MRN: [id number]  Chief Complaint  Patient presents with   Medical Management of Chronic Issues    HPI Patient is in today for urine concerns and right wrist pain.   Discussed the use of AI scribe software for clinical note transcription with the patient, who gave verbal consent to proceed.  History of Present Illness He is a 43 year old male who presents with urinary changes and lower back pain.  He has been experiencing bubbly urine and lower back pain for the past four months. During a DOT physical four years ago, he was informed of significant proteinuria. Despite these symptoms, he has not sought medical attention until now. He is concerned about potential kidney issues after reading about symptoms online. No burning sensation or hematuria. His urine is sometimes yellowish-clear, and he maintains high water intake.  The lower back pain is severe and located at the waistline, with no recent injury reported. His occupation involves floor work and lifting, which may contribute to his symptoms. The pain sometimes worsens with running.  He reports a right wrist injury from last August, sustained while playing basketball, resulting in persistent pain and popping in the right wrist. He was previously given a brace but has not followed up with a specialist. He experiences pain with bending and is unable to perform push-ups.  No chest pain, burning during urination, blood in urine, urgency, frequency, or trouble with urination stream.      ROS All review of systems negative except what is listed in the HPI      Objective:    BP 130/70   Pulse 69   Ht 6\' 2"  (1.88 m)   Wt 298 lb (135.2 kg)   SpO2 97%   BMI 38.26 kg/m    Physical Exam Vitals reviewed.  Constitutional:      Appearance: Normal appearance.  Cardiovascular:     Rate and Rhythm: Normal rate and regular rhythm.      Heart sounds: Normal heart sounds.  Pulmonary:     Effort: Pulmonary effort is normal.     Breath sounds: Normal breath sounds.  Musculoskeletal:     Comments: Right wrist with palpable clicking/popping with flexion  Skin:    General: Skin is warm and dry.  Neurological:     Mental Status: He is alert and oriented to person, place, and time.  Psychiatric:        Mood and Affect: Mood normal.        Behavior: Behavior normal.        Thought Content: Thought content normal.        Judgment: Judgment normal.      No results found for any visits on 08/04/23.      Assessment & Plan:   Problem List Items Addressed This Visit   None Visit Diagnoses       Prediabetes    -  Primary   Relevant Orders   Comprehensive metabolic panel with GFR   Urinalysis w microscopic + reflex cultur   Hemoglobin A1c     Chronic bilateral low back pain, unspecified whether sciatica present       Relevant Orders   Ambulatory referral to Sports Medicine   Urinalysis w microscopic + reflex cultur     Right wrist pain       Relevant Orders   Ambulatory referral to Sports  Medicine       Assessment & Plan Right wrist pain Chronic wrist pain with popping. Normal x-ray in December, but symptoms persist. - Refer to sports medicine for evaluation and management.   Proteinuria and lower back pain Bubbly urine and proteinuria noted previously. Lower back pain likely musculoskeletal, no CVA tenderness. Normal kidney function tests previously. - Perform urinalysis  - Blood work ordered - Consider further testing if proteinuria confirmed. - Recommend physical therapy or home exercises, heat, ice, massage, etc for musculoskeletal lower back pain.    No orders of the defined types were placed in this encounter.   Return if symptoms worsen or fail to improve.  Everlina Hock, NP

## 2023-08-04 NOTE — Addendum Note (Signed)
 Addended by: Minna Amass B on: 08/04/2023 04:07 PM   Modules accepted: Orders

## 2023-08-04 NOTE — Telephone Encounter (Signed)
 Pt just saw Minna Amass today and he wants to get some pain meds. Pt phone number is [phone] and he wants it called into [facility] at [place].

## 2023-08-05 LAB — URINALYSIS W MICROSCOPIC + REFLEX CULTURE
Bacteria, UA: NONE SEEN /HPF
Bilirubin Urine: NEGATIVE
Glucose, UA: NEGATIVE
Hgb urine dipstick: NEGATIVE
Hyaline Cast: NONE SEEN /LPF
Ketones, ur: NEGATIVE
Leukocyte Esterase: NEGATIVE
Nitrites, Initial: NEGATIVE
Protein, ur: NEGATIVE
RBC / HPF: NONE SEEN /HPF (ref 0–2)
Specific Gravity, Urine: 1.022 (ref 1.001–1.035)
Squamous Epithelial / HPF: NONE SEEN /HPF (ref <=5)
WBC, UA: NONE SEEN /HPF (ref 0–5)
pH: 6 (ref 5.0–8.0)

## 2023-08-05 LAB — HEMOGLOBIN A1C: Hgb A1c MFr Bld: 6 % (ref 4.6–6.5)

## 2023-08-05 LAB — COMPREHENSIVE METABOLIC PANEL WITH GFR
ALT: 57 U/L — ABNORMAL HIGH (ref 0–53)
AST: 45 U/L — ABNORMAL HIGH (ref 0–37)
Albumin: 4.4 g/dL (ref 3.5–5.2)
Alkaline Phosphatase: 36 U/L — ABNORMAL LOW (ref 39–117)
BUN: 16 mg/dL (ref 6–23)
CO2: 29 meq/L (ref 19–32)
Calcium: 9.7 mg/dL (ref 8.4–10.5)
Chloride: 104 meq/L (ref 96–112)
Creatinine, Ser: 1.28 mg/dL (ref 0.40–1.50)
GFR: 69.84 mL/min (ref >60.00)
Glucose, Bld: 100 mg/dL — ABNORMAL HIGH (ref 70–99)
Potassium: 4.6 meq/L (ref 3.5–5.1)
Sodium: 140 meq/L (ref 135–145)
Total Bilirubin: 0.4 mg/dL (ref 0.2–1.2)
Total Protein: 7.7 g/dL (ref 6.0–8.3)

## 2023-08-05 LAB — NO CULTURE INDICATED

## 2023-08-05 NOTE — Addendum Note (Signed)
 Addended by: Minna Amass B on: 08/05/2023 12:05 PM   Modules accepted: Orders

## 2023-08-05 NOTE — Telephone Encounter (Signed)
 Patient made aware of Meloxicam  RX and message from Louann.

## 2023-08-11 NOTE — Progress Notes (Unsigned)
    [name] D.[facility] [address] [place] [address] Phone: [phone]   Assessment and Plan:     There are no diagnoses linked to this encounter.  ***   Pertinent previous records reviewed include ***    Follow Up: ***     Subjective:   I, [name], am serving as a Neurosurgeon for Doctor Ulysees Gander  Chief Complaint: low back and right wrist pain  HPI:   08/12/2023 Patient is a 43 year old male with low back and right wrist pain. Patient states  Relevant Historical Information: ***  Additional pertinent review of systems negative.   Current Outpatient Medications:    EPINEPHrine  0.3 mg/0.3 mL IJ SOAJ injection, Inject 0.3 mg into the muscle as needed for anaphylaxis. (Patient not taking: Reported on 08/04/2023), Disp: 1 each, Rfl: 1   meloxicam  (MOBIC ) 15 MG tablet, Take 1 tablet (15 mg total) by mouth daily., Disp: 30 tablet, Rfl: 0   Objective:     There were no vitals filed for this visit.    There is no height or weight on file to calculate BMI.    Physical Exam:    ***   Electronically signed by:  Marshall Skeeter D.[facility] 10:28 AM 08/11/23

## 2023-08-12 ENCOUNTER — Ambulatory Visit (INDEPENDENT_AMBULATORY_CARE_PROVIDER_SITE_OTHER): Admitting: Sports Medicine

## 2023-08-12 ENCOUNTER — Ambulatory Visit (INDEPENDENT_AMBULATORY_CARE_PROVIDER_SITE_OTHER)

## 2023-08-12 VITALS — BP 140/70 | HR 91 | Ht 74.0 in

## 2023-08-12 DIAGNOSIS — M545 Low back pain, unspecified: Secondary | ICD-10-CM

## 2023-08-12 DIAGNOSIS — M2550 Pain in unspecified joint: Secondary | ICD-10-CM | POA: Diagnosis not present

## 2023-08-12 DIAGNOSIS — G8929 Other chronic pain: Secondary | ICD-10-CM

## 2023-08-12 DIAGNOSIS — M25531 Pain in right wrist: Secondary | ICD-10-CM

## 2023-08-12 DIAGNOSIS — R768 Other specified abnormal immunological findings in serum: Secondary | ICD-10-CM | POA: Diagnosis not present

## 2023-08-12 LAB — FERRITIN: Ferritin: 112.4 ng/mL (ref 22.0–322.0)

## 2023-08-12 LAB — CBC WITH DIFFERENTIAL/PLATELET
Basophils Absolute: 0 10*3/uL (ref 0.0–0.1)
Basophils Relative: 0.6 % (ref 0.0–3.0)
Eosinophils Absolute: 0.3 10*3/uL (ref 0.0–0.7)
Eosinophils Relative: 4.7 % (ref 0.0–5.0)
HCT: 43.7 % (ref 39.0–52.0)
Hemoglobin: 14.1 g/dL (ref 13.0–17.0)
Lymphocytes Relative: 34.3 % (ref 12.0–46.0)
Lymphs Abs: 2 10*3/uL (ref 0.7–4.0)
MCHC: 32.3 g/dL (ref 30.0–36.0)
MCV: 84.6 fl (ref 78.0–100.0)
Monocytes Absolute: 0.5 10*3/uL (ref 0.1–1.0)
Monocytes Relative: 8.2 % (ref 3.0–12.0)
Neutro Abs: 3 10*3/uL (ref 1.4–7.7)
Neutrophils Relative %: 52.2 % (ref 43.0–77.0)
Platelets: 146 10*3/uL — ABNORMAL LOW (ref 150.0–400.0)
RBC: 5.16 Mil/uL (ref 4.22–5.81)
RDW: 13.8 % (ref 11.5–15.5)
WBC: 5.8 10*3/uL (ref 4.0–10.5)

## 2023-08-12 LAB — TSH: TSH: 0.65 u[IU]/mL (ref 0.35–5.50)

## 2023-08-12 LAB — COMPREHENSIVE METABOLIC PANEL WITH GFR
ALT: 53 U/L (ref 0–53)
AST: 44 U/L — ABNORMAL HIGH (ref 0–37)
Albumin: 4.4 g/dL (ref 3.5–5.2)
Alkaline Phosphatase: 38 U/L — ABNORMAL LOW (ref 39–117)
BUN: 13 mg/dL (ref 6–23)
CO2: 32 meq/L (ref 19–32)
Calcium: 9.7 mg/dL (ref 8.4–10.5)
Chloride: 104 meq/L (ref 96–112)
Creatinine, Ser: 0.94 mg/dL (ref 0.40–1.50)
GFR: 101.15 mL/min (ref >60.00)
Glucose, Bld: 108 mg/dL — ABNORMAL HIGH (ref 70–99)
Potassium: 4.1 meq/L (ref 3.5–5.1)
Sodium: 141 meq/L (ref 135–145)
Total Bilirubin: 0.5 mg/dL (ref 0.2–1.2)
Total Protein: 7.7 g/dL (ref 6.0–8.3)

## 2023-08-12 LAB — SEDIMENTATION RATE: Sed Rate: 16 mm/h — ABNORMAL HIGH (ref 0–15)

## 2023-08-12 LAB — C-REACTIVE PROTEIN: CRP: <1 mg/dL (ref 0.5–20.0)

## 2023-08-12 LAB — URIC ACID: Uric Acid, Serum: 6.5 mg/dL (ref 4.0–7.8)

## 2023-08-12 LAB — VITAMIN D 25 HYDROXY (VIT D DEFICIENCY, FRACTURES): VITD: 15.59 ng/mL — ABNORMAL LOW (ref 30.00–100.00)

## 2023-08-12 NOTE — Patient Instructions (Addendum)
-   Start meloxicam  15 mg daily x2 weeks.  If still having pain after 2 weeks, complete 3rd-week of NSAID. May use remaining NSAID as needed once daily for pain control.  Do not to use additional over-the-counter NSAIDs (ibuprofen , naproxen , Advil , Aleve , etc.) while taking prescription NSAIDs.  May use Tylenol  [phone] mg 2 to 3 times a day for breakthrough pain.  Labs today. HEP for lower back and wrist Wear right wrist brace during activities. Follow up in 4 weeks.

## 2023-08-14 LAB — CYCLIC CITRUL PEPTIDE ANTIBODY, IGG: Cyclic Citrullin Peptide Ab: 24 U — ABNORMAL HIGH

## 2023-08-14 LAB — RHEUMATOID FACTOR: Rheumatoid fact SerPl-aCnc: <10 [IU]/mL (ref <14)

## 2023-08-14 LAB — HLA-B27 ANTIGEN: HLA-B27 Antigen: NEGATIVE

## 2023-08-16 LAB — ANA+ENA+DNA/DS+SCL 70+SJOSSA/B
ANA Titer 1: POSITIVE — AB
ENA RNP Ab: >8 AI — ABNORMAL HIGH (ref 0.0–0.9)
ENA SM Ab Ser-aCnc: 0.4 AI (ref 0.0–0.9)
ENA SSA (RO) Ab: 0.3 AI (ref 0.0–0.9)
ENA SSB (LA) Ab: <0.2 AI (ref 0.0–0.9)
Scleroderma (Scl-70) (ENA) Antibody, IgG: >8 AI — ABNORMAL HIGH (ref 0.0–0.9)
dsDNA Ab: <1 [IU]/mL (ref 0–9)

## 2023-08-16 LAB — FANA STAINING PATTERNS: Homogeneous Pattern: 1:640 {titer} — ABNORMAL HIGH

## 2023-08-18 ENCOUNTER — Other Ambulatory Visit: Payer: Self-pay | Admitting: Sports Medicine

## 2023-08-18 DIAGNOSIS — M349 Systemic sclerosis, unspecified: Secondary | ICD-10-CM

## 2023-08-18 DIAGNOSIS — M2550 Pain in unspecified joint: Secondary | ICD-10-CM

## 2023-08-18 DIAGNOSIS — R768 Other specified abnormal immunological findings in serum: Secondary | ICD-10-CM

## 2023-08-18 NOTE — Progress Notes (Signed)
 Can we refer patient to rheumatology for positive ANA, positive rheumatoid factor, scleroderma, polyarthralgia.  Patient is already aware of this referralAnd does not need a phone call.

## 2023-08-20 ENCOUNTER — Ambulatory Visit: Payer: BC Managed Care – PPO | Attending: Vascular Surgery | Admitting: Vascular Surgery

## 2023-08-21 ENCOUNTER — Other Ambulatory Visit (INDEPENDENT_AMBULATORY_CARE_PROVIDER_SITE_OTHER)

## 2023-08-21 DIAGNOSIS — R748 Abnormal levels of other serum enzymes: Secondary | ICD-10-CM | POA: Diagnosis not present

## 2023-08-21 LAB — CBC WITH DIFFERENTIAL/PLATELET
Basophils Absolute: 0 10*3/uL (ref 0.0–0.1)
Basophils Relative: 0.4 % (ref 0.0–3.0)
Eosinophils Absolute: 0.2 10*3/uL (ref 0.0–0.7)
Eosinophils Relative: 3.9 % (ref 0.0–5.0)
HCT: 42.6 % (ref 39.0–52.0)
Hemoglobin: 13.8 g/dL (ref 13.0–17.0)
Lymphocytes Relative: 38.3 % (ref 12.0–46.0)
Lymphs Abs: 2.2 10*3/uL (ref 0.7–4.0)
MCHC: 32.3 g/dL (ref 30.0–36.0)
MCV: 84.4 fl (ref 78.0–100.0)
Monocytes Absolute: 0.6 10*3/uL (ref 0.1–1.0)
Monocytes Relative: 10.3 % (ref 3.0–12.0)
Neutro Abs: 2.7 10*3/uL (ref 1.4–7.7)
Neutrophils Relative %: 47.1 % (ref 43.0–77.0)
Platelets: 146 10*3/uL — ABNORMAL LOW (ref 150.0–400.0)
RBC: 5.04 Mil/uL (ref 4.22–5.81)
RDW: 14 % (ref 11.5–15.5)
WBC: 5.8 10*3/uL (ref 4.0–10.5)

## 2023-08-21 LAB — COMPREHENSIVE METABOLIC PANEL WITH GFR
ALT: 55 U/L — ABNORMAL HIGH (ref 0–53)
AST: 42 U/L — ABNORMAL HIGH (ref 0–37)
Albumin: 4.4 g/dL (ref 3.5–5.2)
Alkaline Phosphatase: 36 U/L — ABNORMAL LOW (ref 39–117)
BUN: 10 mg/dL (ref 6–23)
CO2: 30 meq/L (ref 19–32)
Calcium: 9.3 mg/dL (ref 8.4–10.5)
Chloride: 103 meq/L (ref 96–112)
Creatinine, Ser: 0.85 mg/dL (ref 0.40–1.50)
GFR: 108.4 mL/min (ref >60.00)
Glucose, Bld: 92 mg/dL (ref 70–99)
Potassium: 4.2 meq/L (ref 3.5–5.1)
Sodium: 139 meq/L (ref 135–145)
Total Bilirubin: 0.5 mg/dL (ref 0.2–1.2)
Total Protein: 7.5 g/dL (ref 6.0–8.3)

## 2023-09-01 ENCOUNTER — Other Ambulatory Visit: Payer: Self-pay | Admitting: Family Medicine

## 2023-09-01 DIAGNOSIS — M25531 Pain in right wrist: Secondary | ICD-10-CM

## 2023-09-01 DIAGNOSIS — M545 Low back pain, unspecified: Secondary | ICD-10-CM

## 2023-09-05 NOTE — Progress Notes (Deleted)
    [name] D.[facility] [address] [place] [address] Phone: [phone]   Assessment and Plan:     There are no diagnoses linked to this encounter.  ***   Pertinent previous records reviewed include ***    Follow Up: ***     Subjective:   I, [name], am serving as a Neurosurgeon for Doctor Ulysees Gander  Chief Complaint: low back and right wrist pain   HPI:    08/12/2023 Patient is a 43 year old male with low back and right wrist pain. 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 Wrist has been messed up since last Aug. Patient states that he can not do a push up. Works for [employer] and some times the pain in right and back limits him from doing certain jobs. Wrist pops every time he tries to do circular motions. Was given a cast from another provider which he stopped using two to three months ago and did not wear it at night. Pain is consistent throughout the day. It does not wake him up at night. If he turns the wrong way in bed it is arrogated in the morning when he gets up. He sometimes has a sharp pain in his left shoulder above his heart and is concerned about what that could be.   Did you have an Injury to cause this pain?no  Taking Medication for pain? Yes, meloxicam  Numbness or Tingling? no Does the pain Radiate? no Altered gait or use? yes ROM/ impairment of movement? yes   09/09/2023 Patient states   Relevant Historical Information: Positive ANA  Additional pertinent review of systems negative.   Current Outpatient Medications:    EPINEPHrine  0.3 mg/0.3 mL IJ SOAJ injection, Inject 0.3 mg into the muscle as needed for anaphylaxis. (Patient not taking: Reported on 08/12/2023), Disp: 1 each, Rfl: 1   meloxicam  (MOBIC ) 15 MG tablet, TAKE 1 TABLET(15 MG) BY MOUTH DAILY, Disp: 30 tablet, Rfl: 0   Objective:     There were no vitals filed for this visit.    There  is no height or weight on file to calculate BMI.    Physical Exam:    ***   Electronically signed by:  [name] D.[facility] 7:36 AM 09/05/23

## 2023-09-09 ENCOUNTER — Ambulatory Visit: Admitting: Sports Medicine

## 2023-09-11 NOTE — Progress Notes (Unsigned)
    Ben Jackson D.[facility] [address] [place] [address] Phone: [phone]   Assessment and Plan:     There are no diagnoses linked to this encounter.  ***   Pertinent previous records reviewed include ***    Follow Up: ***     Subjective:   I, [name], am serving as a Neurosurgeon for Doctor Ulysees Gander  Chief Complaint: low back and right wrist pain   HPI:    08/12/2023 Patient is a 43 year old male with low back and right wrist pain. 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 Wrist has been messed up since last Aug. Patient states that he can not do a push up. Works for [employer] and some times the pain in right and back limits him from doing certain jobs. Wrist pops every time he tries to do circular motions. Was given a cast from another provider which he stopped using two to three months ago and did not wear it at night. Pain is consistent throughout the day. It does not wake him up at night. If he turns the wrong way in bed it is arrogated in the morning when he gets up. He sometimes has a sharp pain in his left shoulder above his heart and is concerned about what that could be.   Did you have an Injury to cause this pain?no  Taking Medication for pain? Yes, meloxicam  Numbness or Tingling? no Does the pain Radiate? no Altered gait or use? yes ROM/ impairment of movement? yes   09/12/2023 Patient states   Relevant Historical Information: Positive ANA  Additional pertinent review of systems negative.   Current Outpatient Medications:    EPINEPHrine  0.3 mg/0.3 mL IJ SOAJ injection, Inject 0.3 mg into the muscle as needed for anaphylaxis. (Patient not taking: Reported on 08/12/2023), Disp: 1 each, Rfl: 1   meloxicam  (MOBIC ) 15 MG tablet, TAKE 1 TABLET(15 MG) BY MOUTH DAILY, Disp: 30 tablet, Rfl: 0   Objective:     There were no vitals filed for this visit.    There  is no height or weight on file to calculate BMI.    Physical Exam:    ***   Electronically signed by:  Marshall Skeeter D.[facility] 7:41 AM 09/11/23

## 2023-09-12 ENCOUNTER — Ambulatory Visit: Admitting: Sports Medicine

## 2023-09-12 VITALS — HR 78 | Ht 74.0 in | Wt 304.0 lb

## 2023-09-12 DIAGNOSIS — G8929 Other chronic pain: Secondary | ICD-10-CM

## 2023-09-12 DIAGNOSIS — M349 Systemic sclerosis, unspecified: Secondary | ICD-10-CM

## 2023-09-12 DIAGNOSIS — S62124A Nondisplaced fracture of lunate [semilunar], right wrist, initial encounter for closed fracture: Secondary | ICD-10-CM | POA: Diagnosis not present

## 2023-09-12 DIAGNOSIS — M545 Low back pain, unspecified: Secondary | ICD-10-CM

## 2023-09-12 DIAGNOSIS — M25531 Pain in right wrist: Secondary | ICD-10-CM | POA: Diagnosis not present

## 2023-09-12 DIAGNOSIS — M2550 Pain in unspecified joint: Secondary | ICD-10-CM

## 2023-09-12 DIAGNOSIS — R768 Other specified abnormal immunological findings in serum: Secondary | ICD-10-CM

## 2023-09-12 NOTE — Patient Instructions (Signed)
 Recommend a thumb spica splint .Aaron Aas Should be worn at all times MRI referral  Follow up 1 week after to discuss results

## 2023-09-16 ENCOUNTER — Encounter (HOSPITAL_COMMUNITY): Payer: Self-pay | Admitting: *Deleted

## 2023-09-16 ENCOUNTER — Ambulatory Visit (HOSPITAL_COMMUNITY)
Admission: EM | Admit: 2023-09-16 | Discharge: 2023-09-16 | Disposition: A | Discharge status: Home / Self Care | Attending: Family Medicine | Admitting: Family Medicine

## 2023-09-16 DIAGNOSIS — M7989 Other specified soft tissue disorders: Secondary | ICD-10-CM | POA: Diagnosis not present

## 2023-09-16 DIAGNOSIS — T7840XA Allergy, unspecified, initial encounter: Secondary | ICD-10-CM | POA: Diagnosis not present

## 2023-09-16 MED ORDER — PREDNISONE 20 MG PO TABS: 40.0000 mg | ORAL_TABLET | Freq: Every day | ORAL | 0 refills | Status: AC

## 2023-09-16 MED ORDER — DEXAMETHASONE SODIUM PHOSPHATE 10 MG/ML IJ SOLN
10.0000 mg | Freq: Once | INTRAMUSCULAR | Status: AC
  Administered 2023-09-16: 10 mg via INTRAMUSCULAR

## 2023-09-16 MED ORDER — DEXAMETHASONE SODIUM PHOSPHATE 10 MG/ML IJ SOLN
INTRAMUSCULAR | Status: AC
  Filled 2023-09-16: qty 1

## 2023-09-16 NOTE — Discharge Instructions (Signed)
 You have been given a shot of dexamethasone 10 mg, steroid.  Take prednisone  20 mg--2 daily for 5 days  You can still take Benadryl  or Zyrtec as needed for itching.  Follow-up with your primary care or your allergist about this issue.

## 2023-09-16 NOTE — ED Triage Notes (Addendum)
 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 There is redness and swelling. He used his epipen  this morning @2am . He took benadryl  yesterday and Sunday but none today.    Pt able to hold a conversation without issue, denies any SOB or chest tightness.

## 2023-09-16 NOTE — ED Provider Notes (Signed)
 [facility]    CSN: [id number] Arrival date & time: 09/16/23  1029      History   Chief Complaint Chief Complaint  Patient presents with   Allergic Reaction    HPI [name] is a 43 y.o. male.    Allergic Reaction Here for swelling and itching and redness of his left upper arm.  2 days ago on June 1, he was mowing the yard when he felt a sting on his posterior upper left arm.  He never saw the insect.  No fever chills or wheezing or sensation of lip or throat swelling.  He continued to bother him and was swollen by the next day.  This morning early about 2 in the morning, he used his EpiPen , because he did not know what else to do.  Again he has not had any shortness of breath or wheezing and no sensation of his throat closing or swelling or his lip swelling or his tongue swelling.  He is not allergic to medications.  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 No pertinent surgical history.     Home Medications    Prior to Admission medications   Medication Sig Start Date End Date Taking? Authorizing Provider  EPINEPHrine  0.3 mg/0.3 mL IJ SOAJ injection Inject 0.3 mg into the muscle as needed for anaphylaxis. 04/29/23  Yes [name], Rema Care, MD  predniSONE  (DELTASONE ) 20 MG tablet Take 2 tablets (40 mg total) by mouth daily with breakfast for 5 days. 09/16/23 09/21/23 Yes Ann Keto, MD  meloxicam  (MOBIC ) 15 MG tablet TAKE 1 TABLET(15 MG) BY MOUTH DAILY 09/01/23   Everlina Hock, NP    Family  History Family History  Problem Relation Age of Onset   Arthritis Mother    Allergic rhinitis Father    Arthritis Father    Diabetes Father    Lupus Sister    Diabetes Maternal Grandmother    Diabetes Paternal Grandmother     Social History Social History   Tobacco Use   Smoking status: Former    Types: Cigarettes    Start date: 2004    Passive exposure: Never   Smokeless tobacco: Never  Vaping Use   Vaping status: Never Used  Substance Use Topics   Alcohol use: Yes    Alcohol/week: 2.0 standard drinks of alcohol    Types: 2 Cans of beer per week    Comment: rarely   Drug use: Not Currently    Frequency: 3.0 times per week    Types: Marijuana     Allergies   Other and Bee venom   Review of Systems Review of Systems   Physical Exam Triage Vital Signs ED Triage Vitals  Encounter Vitals Group     BP 09/16/23 1131 137/84     Systolic BP Percentile --      Diastolic BP Percentile --      Pulse Rate 09/16/23 1131 68     Resp 09/16/23  1131 18     Temp 09/16/23 1131 97.8 F (36.6 C)     Temp Source 09/16/23 1131 Oral     SpO2 09/16/23 1131 94 %     Weight --      Height --      Head Circumference --      Peak Flow --      Pain Score 09/16/23 1130 7     Pain Loc --      Pain Education --      Exclude from Growth Chart --    No data found.  Updated Vital Signs BP 137/84 (BP Location: Right Arm)   Pulse 68   Temp 97.8 F (36.6 C) (Oral)   Resp 18   SpO2 94%   Visual Acuity Right Eye Distance:   Left Eye Distance:   Bilateral Distance:    Right Eye Near:   Left Eye Near:    Bilateral Near:     Physical Exam Vitals reviewed.  Constitutional:      General: He is not in acute distress.    Appearance: He is not ill-appearing, toxic-appearing or diaphoretic.  HENT:     Mouth/Throat:     Mouth: Mucous membranes are moist.     Comments: Oropharynx is normal and tongue is not swollen and is normal.  Lips are not swollen. Eyes:     Extraocular  Movements: Extraocular movements intact.     Conjunctiva/sclera: Conjunctivae normal.     Pupils: Pupils are equal, round, and reactive to light.  Cardiovascular:     Rate and Rhythm: Normal rate and regular rhythm.     Heart sounds: No murmur heard. Pulmonary:     Effort: Pulmonary effort is normal. No respiratory distress.     Breath sounds: Normal breath sounds. No stridor. No wheezing, rhonchi or rales.  Musculoskeletal:     Cervical back: Neck supple.  Lymphadenopathy:     Cervical: No cervical adenopathy.  Skin:    Coloration: Skin is not jaundiced or pale.  Neurological:     General: No focal deficit present.     Mental Status: He is alert and oriented to person, place, and time.  Psychiatric:        Behavior: Behavior normal.      UC Treatments / Results  Labs (all labs ordered are listed, but only abnormal results are displayed) Labs Reviewed - No data to display  EKG   Radiology No results found.  Procedures Procedures (including critical care time)  Medications Ordered in UC Medications  dexamethasone (DECADRON) injection 10 mg (has no administration in time range)    Initial Impression / Assessment and Plan / UC Course  I have reviewed the triage vital signs and the nursing notes.  Pertinent labs & imaging results that were available during my care of the patient were reviewed by me and considered in my medical decision making (see chart for details).     Decadron is given here and a 5-day burst of prednisone  is sent to the pharmacy.  I discussed with him what symptoms would indicate certain need for his EpiPen .  He does have 1 more at home.   Final Clinical Impressions(s) / UC Diagnoses   Final diagnoses:  Left arm swelling  Allergic reaction, initial encounter     Discharge Instructions      You have been given a shot of dexamethasone 10 mg, steroid.  Take prednisone  20 mg--2 daily for 5 days  You can still  take Benadryl  or Zyrtec as  needed for itching.  Follow-up with your primary care or your allergist about this issue.     ED Prescriptions     Medication Sig Dispense Auth. Provider   predniSONE  (DELTASONE ) 20 MG tablet Take 2 tablets (40 mg total) by mouth daily with breakfast for 5 days. 10 tablet Ellsworth Haas Onetha Gaffey K, MD      PDMP not reviewed this encounter.   Ann Keto, MD 09/16/23 [phone]

## 2023-09-17 ENCOUNTER — Encounter: Payer: Self-pay | Admitting: Sports Medicine

## 2023-09-18 ENCOUNTER — Encounter: Payer: BC Managed Care – PPO | Admitting: Internal Medicine

## 2023-09-18 NOTE — Progress Notes (Deleted)
 Office Visit Note  Patient: [name]             Date of Birth: Jun 27, 1980           MRN: [id number]             PCP: Everlina Hock, NP Referring: Rob Chihuahua, MD Visit Date: 09/18/2023 Occupation: @GUAROCC @  Subjective:  No chief complaint on file.   History of Present Illness: [name] is a 43 y.o. male ***     Activities of Daily Living:  Patient reports morning stiffness for *** {minute/hour:19697}.   Patient {ACTIONS;DENIES/REPORTS:21021675::"Denies"} nocturnal pain.  Difficulty dressing/grooming: {ACTIONS;DENIES/REPORTS:21021675::"Denies"} Difficulty climbing stairs: {ACTIONS;DENIES/REPORTS:21021675::"Denies"} Difficulty getting out of chair: {ACTIONS;DENIES/REPORTS:21021675::"Denies"} Difficulty using hands for taps, buttons, cutlery, and/or writing: {ACTIONS;DENIES/REPORTS:21021675::"Denies"}  No Rheumatology ROS completed.   PMFS History:  Patient Active Problem List   Diagnosis Date Noted   Class 2 obesity without serious comorbidity with body mass index (BMI) of 38.0 to 38.9 in adult 02/13/2023   Positive ANA (antinuclear antibody) 09/24/2010   Connective tissue disease (HCC) 09/24/2010   skin infection, bilateral hands 09/05/2010    Past Medical History:  Diagnosis Date   Arthritis    Gout    NGU (nongonococcal urethritis)    Raynaud's disease    Skin infection     Family History  Problem Relation Age of Onset   Arthritis Mother    Allergic rhinitis Father    Arthritis Father    Diabetes Father    Lupus Sister    Diabetes Maternal Grandmother    Diabetes Paternal Grandmother    No past surgical history on file. Social History   Social History Narrative   Not on file   Immunization History  Administered Date(s) Administered   DTaP 12/06/1982, 03/04/1983, 05/20/1983, 05/21/1984, 01/03/1988   Hepatitis B, PED/ADOLESCENT 01/16/1994, 02/20/1994, 07/31/1994   IPV 12/06/1982, 03/04/1983, 05/20/1983, 05/21/1984   MMR 01/03/1984      Objective: Vital Signs: There were no vitals taken for this visit.   Physical Exam   Musculoskeletal Exam: ***  CDAI Exam: CDAI Score: -- Patient Global: --; Provider Global: -- Swollen: --; Tender: -- Joint Exam 09/18/2023   No joint exam has been documented for this visit   There is currently no information documented on the homunculus. Go to the Rheumatology activity and complete the homunculus joint exam.  Investigation: No additional findings.  Imaging: No results found.  Recent Labs: Lab Results  Component Value Date   WBC 5.8 08/21/2023   HGB 13.8 08/21/2023   PLT 146.0 (L) 08/21/2023   NA 139 08/21/2023   K 4.2 08/21/2023   CL 103 08/21/2023   CO2 30 08/21/2023   GLUCOSE 92 08/21/2023   BUN 10 08/21/2023   CREATININE 0.85 08/21/2023   BILITOT 0.5 08/21/2023   ALKPHOS 36 (L) 08/21/2023   AST 42 (H) 08/21/2023   ALT 55 (H) 08/21/2023   PROT 7.5 08/21/2023   ALBUMIN 4.4 08/21/2023   CALCIUM 9.3 08/21/2023   GFRAA >60 07/24/2017    Speciality Comments: No specialty comments available.  Procedures:  No procedures performed Allergies: Other and Bee venom   Assessment / Plan:     Visit Diagnoses: No diagnosis found.  Orders: No orders of the defined types were placed in this encounter.  No orders of the defined types were placed in this encounter.   Face-to-face time spent with patient was *** minutes. Greater than 50% of time was spent in counseling and coordination  of care.  Follow-Up Instructions: No follow-ups on file.   Matt Song, MD  Note - This record has been created using [employer].  Chart creation errors have been sought, but may not always  have been located. Such creation errors do not reflect on  the standard of medical care.

## 2023-09-23 ENCOUNTER — Inpatient Hospital Stay: Admission: RE | Admit: 2023-09-23 | Source: Ambulatory Visit

## 2023-10-06 ENCOUNTER — Other Ambulatory Visit: Payer: Self-pay

## 2023-10-06 ENCOUNTER — Encounter (HOSPITAL_COMMUNITY): Payer: Self-pay | Admitting: Emergency Medicine

## 2023-10-06 ENCOUNTER — Ambulatory Visit (HOSPITAL_COMMUNITY)
Admission: EM | Admit: 2023-10-06 | Discharge: 2023-10-06 | Disposition: A | Discharge status: Home / Self Care | Attending: Nurse Practitioner | Admitting: Nurse Practitioner

## 2023-10-06 DIAGNOSIS — G8929 Other chronic pain: Secondary | ICD-10-CM | POA: Diagnosis not present

## 2023-10-06 DIAGNOSIS — M79642 Pain in left hand: Secondary | ICD-10-CM | POA: Diagnosis not present

## 2023-10-06 MED ORDER — DEXAMETHASONE SODIUM PHOSPHATE 10 MG/ML IJ SOLN
INTRAMUSCULAR | Status: AC
  Filled 2023-10-06: qty 1

## 2023-10-06 MED ORDER — DEXAMETHASONE SODIUM PHOSPHATE 10 MG/ML IJ SOLN
10.0000 mg | Freq: Once | INTRAMUSCULAR | Status: AC
  Administered 2023-10-06: 10 mg via INTRAMUSCULAR

## 2023-10-06 NOTE — Discharge Instructions (Signed)
 We have given you an injection of steroid medicine today to help with the pain in your left hand.  Please follow-up with the specialist as planned later this week.  If you develop fever, nausea/vomiting, or worsening of pain despite the injection, please seek care in ER.

## 2023-10-06 NOTE — ED Triage Notes (Signed)
 Patient has an appt  with specialist for 6/27

## 2023-10-06 NOTE — ED Provider Notes (Signed)
 [facility]    CSN: [id number] Arrival date & time: 10/06/23  1252      History   Chief Complaint Chief Complaint  Patient presents with   Hand Pain    HPI [name] is a 43 y.o. male.   Patient presents today with worsening left hand pain for the past few days.  Reports symptoms worsened after pressure washing outside over the weekend.  He endorses pain with range of motion of the hand.  No new fall, accident, injury, or trauma to the hand.  Reports history of hand pain in the same hand and has a an appointment with a rheumatologist later this week.  He has taken meloxicam , ibuprofen , and prednisone  intermittently for the hand pain with minimal temporary improvement.  No fevers or nausea/vomiting.  Reports the pain from the hand keeps him up at night.    Past Medical History:  Diagnosis Date   Arthritis    Gout    NGU (nongonococcal urethritis)    Raynaud's disease    Skin infection     Patient Active Problem List   Diagnosis Date Noted   Class 2 obesity without serious comorbidity with body mass index (BMI) of 38.0 to 38.9 in adult 02/13/2023   Positive ANA (antinuclear antibody) 09/24/2010   Connective tissue disease (HCC) 09/24/2010   skin infection, bilateral hands 09/05/2010    History reviewed. No pertinent surgical history.     Home Medications    Prior to Admission medications   Medication Sig Start Date End Date Taking? Authorizing Provider  EPINEPHrine  0.3 mg/0.3 mL IJ SOAJ injection Inject 0.3 mg into the muscle as needed for anaphylaxis. 04/29/23   [name], [name], MD  meloxicam  (MOBIC ) 15 MG tablet TAKE 1 TABLET(15 MG) BY MOUTH DAILY 09/01/23   [name], NP    Family History Family History  Problem Relation Age of Onset   Arthritis Mother    Allergic rhinitis Father    Arthritis Father    Diabetes Father    Lupus Sister    Diabetes Maternal Grandmother    Diabetes Paternal Grandmother     Social History Social History    Tobacco Use   Smoking status: Former    Types: Cigarettes    Start date: 2004    Passive exposure: Never   Smokeless tobacco: Never  Vaping Use   Vaping status: Never Used  Substance Use Topics   Alcohol use: Yes    Alcohol/week: 2.0 standard drinks of alcohol    Types: 2 Cans of beer per week    Comment: rarely   Drug use: Not Currently    Frequency: 3.0 times per week    Types: Marijuana     Allergies   Other and Bee venom   Review of Systems Review of Systems Per HPI  Physical Exam Triage Vital Signs ED Triage Vitals  Encounter Vitals Group     BP 10/06/23 1405 (!) 143/87     Girls Systolic BP Percentile --      Girls Diastolic BP Percentile --      Boys Systolic BP Percentile --      Boys Diastolic BP Percentile --      Pulse Rate 10/06/23 1405 65     Resp 10/06/23 1405 20     Temp 10/06/23 1405 97.9 F (36.6 C)     Temp Source 10/06/23 1405 Oral     SpO2 10/06/23 1405 96 %     Weight --  Height --      Head Circumference --      Peak Flow --      Pain Score 10/06/23 1402 9     Pain Loc --      Pain Education --      Exclude from Growth Chart --    No data found.  Updated Vital Signs BP (!) 143/87 (BP Location: Left Arm) Comment (BP Location): large cuff  Pulse 65   Temp 97.9 F (36.6 C) (Oral)   Resp 20   SpO2 96%   Visual Acuity Right Eye Distance:   Left Eye Distance:   Bilateral Distance:    Right Eye Near:   Left Eye Near:    Bilateral Near:     Physical Exam Vitals and nursing note reviewed.  Constitutional:      General: He is not in acute distress.    Appearance: Normal appearance. He is not toxic-appearing.  Pulmonary:     Effort: Pulmonary effort is normal. No respiratory distress.   Musculoskeletal:     Comments: Inspection: no swelling, bruising, obvious deformity or redness; there are distinct nodular areas over a couple of the joints Palpation: tender to palpation left hand diffusely; no obvious deformities  palpated ROM: Full ROM to left hand, although pain with range of motion Strength: 5/5 left upper extremity Neurovascular: neurovascularly intact in distal left upper extremity   Skin:    General: Skin is warm and dry.     Capillary Refill: Capillary refill takes less than 2 seconds.     Coloration: Skin is not jaundiced or pale.     Findings: No erythema.   Neurological:     Mental Status: He is alert and oriented to person, place, and time.   Psychiatric:        Behavior: Behavior is cooperative.      UC Treatments / Results  Labs (all labs ordered are listed, but only abnormal results are displayed) Labs Reviewed - No data to display  EKG   Radiology No results found.  Procedures Procedures (including critical care time)  Medications Ordered in UC Medications  dexamethasone  (DECADRON ) injection 10 mg (10 mg Intramuscular Given 10/06/23 1438)    Initial Impression / Assessment and Plan / UC Course  I have reviewed the triage vital signs and the nursing notes.  Pertinent labs & imaging results that were available during my care of the patient were reviewed by me and considered in my medical decision making (see chart for details).   Patient is mildly hypertensive in urgent care today, otherwise vital signs are stable.  1. Chronic pain of left hand Review of chart shows he has had rheumatologic workup and may have scleroderma; follow-up with specialist is pending I encouraged him to follow-up with specialist as planned In meantime, we will treat with long-acting corticosteroid; offered oral prednisone  or injection and patient desires Decadron  injection today Discouraged use of NSAIDs for the next few days Return and ER precautions discussed  The patient was given the opportunity to ask questions.  All questions answered to their satisfaction.  The patient is in agreement to this plan.   Final Clinical Impressions(s) / UC Diagnoses   Final diagnoses:  Chronic  pain of left hand     Discharge Instructions      We have given you an injection of steroid medicine today to help with the pain in your left hand.  Please follow-up with the specialist as planned later this week.  If you develop fever, nausea/vomiting, or worsening of pain despite the injection, please seek care in ER.    ED Prescriptions   None    PDMP not reviewed this encounter.   Chandra Harlene LABOR, NP 10/06/23 [phone]

## 2023-10-06 NOTE — ED Triage Notes (Signed)
 Bilateral hand pain.  Repetitive issue since 2008.   Patient reports he has seen provider for this issue.  Has had multiple diagnosis and a variety of treatment plans.    Patient had prednisone  left over from a spider bite from 2 weeks ago.    Symptoms improved, but worsened again

## 2023-10-13 ENCOUNTER — Other Ambulatory Visit: Payer: Self-pay | Admitting: Sports Medicine

## 2023-10-13 ENCOUNTER — Other Ambulatory Visit: Payer: Self-pay | Admitting: Family Medicine

## 2023-10-13 ENCOUNTER — Telehealth: Payer: Self-pay | Admitting: Sports Medicine

## 2023-10-13 DIAGNOSIS — S62124A Nondisplaced fracture of lunate [semilunar], right wrist, initial encounter for closed fracture: Secondary | ICD-10-CM

## 2023-10-13 DIAGNOSIS — M931 Kienbock's disease of adults: Secondary | ICD-10-CM

## 2023-10-13 DIAGNOSIS — M25531 Pain in right wrist: Secondary | ICD-10-CM

## 2023-10-13 DIAGNOSIS — G8929 Other chronic pain: Secondary | ICD-10-CM

## 2023-10-13 NOTE — Progress Notes (Signed)
 Referral placed.

## 2023-10-13 NOTE — Telephone Encounter (Signed)
 Should you be managing or sports med?

## 2023-10-13 NOTE — Telephone Encounter (Signed)
 Patient made aware RX sent, but future refills need to go through treating provider and needs to contact them about imaging. He expressed understanding.

## 2023-10-13 NOTE — Telephone Encounter (Signed)
 Called and spoke with patient.  We received printed report of right wrist MRI without contrast performed on 10/10/2023 at [facility].  Impression showed the following:  1.  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 2.  Central tear of the TFCC. 3.  Degenerative fraying of scapholunate ligament. 4.  Small radiocarpal, distal radial lunar and intercarpal joint effusion.   Patient with continued wrist pain from fall and August 2024.  Discussed that patient needs to be seen by orthopedic hand surgery.  Patient verbalized understanding.  We will send referral to orthopedic hand surgery.  Patient should keep wrist in brace until further evaluation.

## 2023-10-13 NOTE — Telephone Encounter (Signed)
 Referral placed.

## 2023-11-10 ENCOUNTER — Other Ambulatory Visit: Payer: Self-pay | Admitting: Family Medicine

## 2023-11-10 DIAGNOSIS — M25531 Pain in right wrist: Secondary | ICD-10-CM

## 2023-11-10 DIAGNOSIS — M545 Low back pain, unspecified: Secondary | ICD-10-CM

## 2024-02-11 ENCOUNTER — Telehealth: Payer: Self-pay | Admitting: Family Medicine

## 2024-02-11 NOTE — Telephone Encounter (Signed)
 Copied from CRM [id number]. Topic: Referral - Request for Referral >> Feb 11, 2024 11:10 AM Roselie BROCKS wrote: Did the patient discuss referral with their provider in the last year? Yes (If No - schedule appointment) (If Yes - send message)  Appointment offered? No  Type of order/referral and detailed reason for visit: patient needs Cardiologist [facility]  area   Preference of office, provider, location: in [facility] area   If referral order, have you been seen by this specialty before? No (If Yes, this issue or another issue? When? Where?  Can we respond through MyChart? Yes

## 2024-02-11 NOTE — Telephone Encounter (Signed)
 Spoke with patient. He states new symptoms. Leg swelling. Back pain. Rare chest pains (last for a few seconds). Tips of his fingers are pale sometimes. Appt scheduled to discuss with Waddell.

## 2024-02-11 NOTE — Telephone Encounter (Signed)
 I don't see that you have discussed needing a Cardiologist with patient? Please review.

## 2024-02-11 NOTE — Telephone Encounter (Signed)
 Copied from CRM [id number]. Topic: Referral - Request for Referral >> Feb 11, 2024 11:14 AM Roselie BROCKS wrote: Did the patient discuss referral with their provider in the last year? Yes (If No - schedule appointment) (If Yes - send message)  Appointment offered? No  Type of order/referral and detailed reason for visit: patient request referral fo nephrologist   Preference of office, provider, location: in [facility] area   If referral order, have you been seen by this specialty before? No (If Yes, this issue or another issue? When? Where?  Can we respond through MyChart? Yes

## 2024-02-11 NOTE — Telephone Encounter (Signed)
 See what symptoms he's having and if he's ever seen cardiology before. I don't see that we've discussed any cardiac issues. They often want a note to review and for us  to get a workup started while waiting to get in with specialists.

## 2024-02-17 ENCOUNTER — Encounter: Payer: Self-pay | Admitting: Family Medicine

## 2024-02-17 ENCOUNTER — Ambulatory Visit: Admitting: Family Medicine

## 2024-02-17 VITALS — BP 132/76 | HR 76 | Temp 98.2°F | Resp 18 | Ht 74.0 in | Wt 307.6 lb

## 2024-02-17 DIAGNOSIS — M545 Low back pain, unspecified: Secondary | ICD-10-CM | POA: Diagnosis not present

## 2024-02-17 DIAGNOSIS — R0789 Other chest pain: Secondary | ICD-10-CM | POA: Diagnosis not present

## 2024-02-17 DIAGNOSIS — R82998 Other abnormal findings in urine: Secondary | ICD-10-CM | POA: Diagnosis not present

## 2024-02-17 DIAGNOSIS — G8929 Other chronic pain: Secondary | ICD-10-CM

## 2024-02-17 LAB — COMPREHENSIVE METABOLIC PANEL WITH GFR
ALT: 68 U/L — ABNORMAL HIGH (ref 0–53)
AST: 44 U/L — ABNORMAL HIGH (ref 0–37)
Albumin: 4.6 g/dL (ref 3.5–5.2)
Alkaline Phosphatase: 39 U/L (ref 39–117)
BUN: 14 mg/dL (ref 6–23)
CO2: 31 meq/L (ref 19–32)
Calcium: 9.4 mg/dL (ref 8.4–10.5)
Chloride: 101 meq/L (ref 96–112)
Creatinine, Ser: 0.91 mg/dL (ref 0.40–1.50)
GFR: 104.78 mL/min (ref >60.00)
Glucose, Bld: 90 mg/dL (ref 70–99)
Potassium: 4.5 meq/L (ref 3.5–5.1)
Sodium: 138 meq/L (ref 135–145)
Total Bilirubin: 0.4 mg/dL (ref 0.2–1.2)
Total Protein: 7.9 g/dL (ref 6.0–8.3)

## 2024-02-17 LAB — CBC WITH DIFFERENTIAL/PLATELET
Basophils Absolute: 0 K/uL (ref 0.0–0.1)
Basophils Relative: 0.7 % (ref 0.0–3.0)
Eosinophils Absolute: 0.2 K/uL (ref 0.0–0.7)
Eosinophils Relative: 3.2 % (ref 0.0–5.0)
HCT: 42.5 % (ref 39.0–52.0)
Hemoglobin: 13.7 g/dL (ref 13.0–17.0)
Lymphocytes Relative: 42.8 % (ref 12.0–46.0)
Lymphs Abs: 2.4 K/uL (ref 0.7–4.0)
MCHC: 32.2 g/dL (ref 30.0–36.0)
MCV: 83 fl (ref 78.0–100.0)
Monocytes Absolute: 0.6 K/uL (ref 0.1–1.0)
Monocytes Relative: 9.9 % (ref 3.0–12.0)
Neutro Abs: 2.4 K/uL (ref 1.4–7.7)
Neutrophils Relative %: 43.4 % (ref 43.0–77.0)
Platelets: 136 K/uL — ABNORMAL LOW (ref 150.0–400.0)
RBC: 5.12 Mil/uL (ref 4.22–5.81)
RDW: 13.8 % (ref 11.5–15.5)
WBC: 5.6 K/uL (ref 4.0–10.5)

## 2024-02-17 LAB — TROPONIN I: Troponin I: 6 ng/L (ref <=47)

## 2024-02-17 NOTE — Progress Notes (Signed)
 Established Patient Office Visit  Subjective:  Patient ID: [name], male    DOB: 1980-08-16  Age: 43 y.o. MRN: [id number]       HPI [name] is here for various concerns.   Discussed the use of AI scribe software for clinical note transcription with the patient, who gave verbal consent to proceed.  History of Present Illness [name] is a 43 year old male who presents with concerns about foamy urine, lower back pain, and chest pain.  He has experienced foamy urine for about four years. A previous test indicated proteinuria, but a test in April was normal. There is no odor, burning, or hematuria. He is concerned about the foam and is seeking further evaluation.  He has intermittent lower back pain for about a year. Mobic  provides relief, but he has not tried ice or heat. He wears a back brace and his job involves heavy lifting. He has previously seen Dr. Leonce, who ordered an x-ray.  Chest pain began about a month ago, occurring intermittently every other day, but not in the past week. It is described as a 'throbbing point' and feels like a 'zap' or 'shock', located on the left side of his chest, lasting a few seconds. No shortness of breath or diaphoresis during these episodes.   He notes swelling in his legs, present for years. His partner noticed his ankles are swollen, and his socks leave marks. Compression socks were too tight previously.            Past Medical History:  Diagnosis Date   Arthritis    Gout    NGU (nongonococcal urethritis)    Raynaud's disease    Skin infection     History reviewed. No pertinent surgical history.  Family History  Problem Relation Age of Onset   Arthritis Mother    Allergic rhinitis Father    Arthritis Father    Diabetes Father    Lupus Sister    Diabetes Maternal Grandmother    Diabetes Paternal Grandmother     Social History   Socioeconomic History   Marital status: Significant Other    Spouse  name: Not on file   Number of children: Not on file   Years of education: Not on file   Highest education level: Not on file  Occupational History   Not on file  Tobacco Use   Smoking status: Former    Types: Cigarettes    Start date: 2004    Passive exposure: Never   Smokeless tobacco: Never  Vaping Use   Vaping status: Never Used  Substance and Sexual Activity   Alcohol use: Yes    Alcohol/week: 2.0 standard drinks of alcohol    Types: 2 Cans of beer per week    Comment: rarely   Drug use: Not Currently    Frequency: 3.0 times per week    Types: Marijuana   Sexual activity: Yes    Partners: Female    Birth control/protection: Condom  Other Topics Concern   Not on file  Social History Narrative   Not on file   Social Drivers of Health   Financial Resource Strain: Not on file  Food Insecurity: Not on file  Transportation Needs: Not on file  Physical Activity: Not on file  Stress: Not on file  Social Connections: Not on file  Intimate Partner Violence: Not on file    ROS All ROS negative except what is listed in the HPI.  Objective:   Today's Vitals: BP 132/76   Pulse 76   Temp 98.2 F (36.8 C)   Resp 18   Ht 6' 2 (1.88 m)   Wt (!) 307 lb 9.6 oz (139.5 kg)   SpO2 99%   BMI 39.49 kg/m   Physical Exam Vitals reviewed.  Constitutional:      Appearance: Normal appearance.  Cardiovascular:     Rate and Rhythm: Normal rate and regular rhythm.     Heart sounds: Normal heart sounds.  Pulmonary:     Effort: Pulmonary effort is normal.     Breath sounds: Normal breath sounds.  Abdominal:     Tenderness: There is no right CVA tenderness or left CVA tenderness.  Musculoskeletal:     Comments: Minimal nonpitting edema bilaterally  Skin:    General: Skin is warm and dry.  Neurological:     Mental Status: He is alert and oriented to person, place, and time.  Psychiatric:        Mood and Affect: Mood normal.        Behavior: Behavior normal.         Thought Content: Thought content normal.        Judgment: Judgment normal.        Assessment & Plan:   Problem List Items Addressed This Visit   None Visit Diagnoses       Chest discomfort    -  Primary   Relevant Orders   Comprehensive metabolic panel with GFR   CBC with Differential/Platelet   Troponin I   EKG 12-Lead (Completed)     Foamy urine       Relevant Orders   Urinalysis w microscopic + reflex cultur   Protein / creatinine ratio, urine   Comprehensive metabolic panel with GFR   CBC with Differential/Platelet     Chronic bilateral low back pain, unspecified whether sciatica present          Assessment and Plan Assessment & Plan Chest discomfort  Intermittent left pectoral chest pain, likely musculoskeletal or heartburn. No cardiac family history concerns. - Order EKG. - NSR, possible old anterior infarct, nonspecific T-wave abnormality. No ST elevation.  - Order troponin test. - Cardiology referral - he wants to look around and will let me know where to send referral - Advise monitoring for reflux and muscle strain.  Foamy urine - Order urine labs today - Check kidney function.  Occasional leg swelling, no significant swelling today  - Advise properly sized compression socks. - Recommend reducing salt intake. - Advise leg elevation.  Chronic low back pain Chronic bilateral low back pain, possibly occupational. Managed with Mobic . - Advise follow-up with Doctor Leonce if pain persists. - Consider physical therapy if pain continues.        Follow-up: Return for - pending results or sooner if needed.   Waddell FURY Almarie, DNP, FNP-C  I,Emily Lagle,acting as a neurosurgeon for Waddell KATHEE Almarie, NP.,have documented all relevant documentation on the behalf of Waddell KATHEE Almarie, NP.  I, Waddell KATHEE Almarie, NP, have reviewed all documentation for this visit. The documentation on 02/17/2024 for the exam, diagnosis, procedures, and orders are all accurate and complete.

## 2024-02-18 LAB — URINALYSIS W MICROSCOPIC + REFLEX CULTURE
Bacteria, UA: NONE SEEN /HPF
Bilirubin Urine: NEGATIVE
Glucose, UA: NEGATIVE
Hgb urine dipstick: NEGATIVE
Hyaline Cast: NONE SEEN /LPF
Ketones, ur: NEGATIVE
Leukocyte Esterase: NEGATIVE
Nitrites, Initial: NEGATIVE
Protein, ur: NEGATIVE
RBC / HPF: NONE SEEN /HPF (ref 0–2)
Specific Gravity, Urine: 1.023 (ref 1.001–1.035)
Squamous Epithelial / HPF: NONE SEEN /HPF (ref <=5)
WBC, UA: NONE SEEN /HPF (ref 0–5)
pH: 5.5 (ref 5.0–8.0)

## 2024-02-18 LAB — PROTEIN / CREATININE RATIO, URINE
Creatinine, Urine: 161 mg/dL (ref 20–320)
Protein/Creat Ratio: 56 mg/g{creat} (ref 25–148)
Protein/Creatinine Ratio: 0.056 mg/mg{creat} (ref 0.025–0.148)
Total Protein, Urine: 9 mg/dL (ref 5–25)

## 2024-02-18 LAB — NO CULTURE INDICATED

## 2024-02-25 ENCOUNTER — Ambulatory Visit: Payer: Self-pay | Admitting: Family Medicine

## 2024-02-25 DIAGNOSIS — R748 Abnormal levels of other serum enzymes: Secondary | ICD-10-CM

## 2024-03-02 ENCOUNTER — Ambulatory Visit: Payer: Self-pay
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
## 2024-03-03 ENCOUNTER — Encounter (HOSPITAL_BASED_OUTPATIENT_CLINIC_OR_DEPARTMENT_OTHER): Payer: Self-pay | Admitting: Emergency Medicine

## 2024-03-03 ENCOUNTER — Observation Stay (HOSPITAL_BASED_OUTPATIENT_CLINIC_OR_DEPARTMENT_OTHER)
Admission: EM | Admit: 2024-03-03 | Discharge: 2024-03-05 | Disposition: A | Discharge status: Home / Self Care | Source: Ambulatory Visit | Attending: Emergency Medicine | Admitting: Emergency Medicine

## 2024-03-03 ENCOUNTER — Ambulatory Visit (HOSPITAL_BASED_OUTPATIENT_CLINIC_OR_DEPARTMENT_OTHER)

## 2024-03-03 ENCOUNTER — Other Ambulatory Visit: Payer: Self-pay

## 2024-03-03 DIAGNOSIS — R748 Abnormal levels of other serum enzymes: Secondary | ICD-10-CM | POA: Diagnosis not present

## 2024-03-03 DIAGNOSIS — R918 Other nonspecific abnormal finding of lung field: Secondary | ICD-10-CM | POA: Diagnosis not present

## 2024-03-03 DIAGNOSIS — F1290 Cannabis use, unspecified, uncomplicated: Secondary | ICD-10-CM | POA: Diagnosis not present

## 2024-03-03 DIAGNOSIS — I517 Cardiomegaly: Secondary | ICD-10-CM | POA: Insufficient documentation

## 2024-03-03 DIAGNOSIS — E669 Obesity, unspecified: Secondary | ICD-10-CM | POA: Diagnosis present

## 2024-03-03 DIAGNOSIS — J9811 Atelectasis: Secondary | ICD-10-CM | POA: Diagnosis not present

## 2024-03-03 DIAGNOSIS — R42 Dizziness and giddiness: Secondary | ICD-10-CM | POA: Diagnosis present

## 2024-03-03 DIAGNOSIS — R0789 Other chest pain: Secondary | ICD-10-CM | POA: Diagnosis present

## 2024-03-03 DIAGNOSIS — Z6838 Body mass index (BMI) 38.0-38.9, adult: Secondary | ICD-10-CM | POA: Insufficient documentation

## 2024-03-03 DIAGNOSIS — R410 Disorientation, unspecified: Secondary | ICD-10-CM | POA: Diagnosis not present

## 2024-03-03 DIAGNOSIS — R55 Syncope and collapse: Secondary | ICD-10-CM | POA: Diagnosis not present

## 2024-03-03 DIAGNOSIS — E66812 Obesity, class 2: Secondary | ICD-10-CM | POA: Diagnosis not present

## 2024-03-03 DIAGNOSIS — M79669 Pain in unspecified lower leg: Secondary | ICD-10-CM | POA: Diagnosis present

## 2024-03-03 LAB — CBC WITH DIFFERENTIAL/PLATELET
Abs Immature Granulocytes: 0.05 K/uL (ref 0.00–0.07)
Basophils Absolute: 0 K/uL (ref 0.0–0.1)
Basophils Relative: 0 %
Eosinophils Absolute: 0.2 K/uL (ref 0.0–0.5)
Eosinophils Relative: 4 %
HCT: 42.6 % (ref 39.0–52.0)
Hemoglobin: 13.8 g/dL (ref 13.0–17.0)
Immature Granulocytes: 1 %
Lymphocytes Relative: 27 %
Lymphs Abs: 1.8 K/uL (ref 0.7–4.0)
MCH: 27.3 pg (ref 26.0–34.0)
MCHC: 32.4 g/dL (ref 30.0–36.0)
MCV: 84.4 fL (ref 80.0–100.0)
Monocytes Absolute: 0.5 K/uL (ref 0.1–1.0)
Monocytes Relative: 7 %
Neutro Abs: 4.2 K/uL (ref 1.7–7.7)
Neutrophils Relative %: 61 %
Platelets: 165 K/uL (ref 150–400)
RBC: 5.05 MIL/uL (ref 4.22–5.81)
RDW: 13.5 % (ref 11.5–15.5)
WBC: 6.7 K/uL (ref 4.0–10.5)
nRBC: 0 % (ref 0.0–0.2)

## 2024-03-03 LAB — TROPONIN T, HIGH SENSITIVITY
Troponin T High Sensitivity: 18 ng/L (ref 0–19)
Troponin T High Sensitivity: 15 ng/L (ref 0–19)

## 2024-03-03 LAB — COMPREHENSIVE METABOLIC PANEL WITH GFR
ALT: 95 U/L — ABNORMAL HIGH (ref 0–44)
AST: 47 U/L — ABNORMAL HIGH (ref 15–41)
Albumin: 3.9 g/dL (ref 3.5–5.0)
Alkaline Phosphatase: 44 U/L (ref 38–126)
Anion gap: 9 (ref 5–15)
BUN: 10 mg/dL (ref 6–20)
CO2: 29 mmol/L (ref 22–32)
Calcium: 9.7 mg/dL (ref 8.9–10.3)
Chloride: 104 mmol/L (ref 98–111)
Creatinine, Ser: 0.92 mg/dL (ref 0.61–1.24)
GFR, Estimated: >60 mL/min (ref >60)
Glucose, Bld: 129 mg/dL — ABNORMAL HIGH (ref 70–99)
Potassium: 3.8 mmol/L (ref 3.5–5.1)
Sodium: 141 mmol/L (ref 135–145)
Total Bilirubin: 0.6 mg/dL (ref 0.0–1.2)
Total Protein: 7.3 g/dL (ref 6.5–8.1)

## 2024-03-03 MED ORDER — LACTATED RINGERS IV SOLN: INTRAVENOUS | Status: DC

## 2024-03-03 NOTE — Telephone Encounter (Signed)
 Appt scheduled with [place]. FYI.
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
## 2024-03-03 NOTE — ED Notes (Signed)
 ED Provider at bedside.

## 2024-03-03 NOTE — Progress Notes (Signed)
-----------------------------------------------------------  [facility] --------------------------------------------------------D(Data) A(Action) R(response) Note------------------------------------------------  Patient Name: [name] Patient DOB: August 02, 1980 Date: @TODAY @      Data: 43 yo male no significant PMH, multiple syncopal episodes, will need EEG, neuro consult.      Action: No action needed at this time.      Response:       Sharolyn Batman, RN [facility]
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
## 2024-03-04 ENCOUNTER — Emergency Department (HOSPITAL_COMMUNITY)

## 2024-03-04 ENCOUNTER — Observation Stay (HOSPITAL_COMMUNITY)

## 2024-03-04 ENCOUNTER — Inpatient Hospital Stay: Admitting: Medical

## 2024-03-04 DIAGNOSIS — R55 Syncope and collapse: Secondary | ICD-10-CM

## 2024-03-04 DIAGNOSIS — Z743 Need for continuous supervision: Secondary | ICD-10-CM | POA: Diagnosis not present

## 2024-03-04 DIAGNOSIS — R569 Unspecified convulsions: Secondary | ICD-10-CM | POA: Diagnosis not present

## 2024-03-04 DIAGNOSIS — E669 Obesity, unspecified: Secondary | ICD-10-CM | POA: Diagnosis present

## 2024-03-04 DIAGNOSIS — M79669 Pain in unspecified lower leg: Secondary | ICD-10-CM | POA: Diagnosis present

## 2024-03-04 DIAGNOSIS — R0789 Other chest pain: Secondary | ICD-10-CM | POA: Diagnosis present

## 2024-03-04 LAB — CBC
HCT: 41.6 % (ref 39.0–52.0)
Hemoglobin: 13.3 g/dL (ref 13.0–17.0)
MCH: 26.9 pg (ref 26.0–34.0)
MCHC: 32 g/dL (ref 30.0–36.0)
MCV: 84.2 fL (ref 80.0–100.0)
Platelets: 184 K/uL (ref 150–400)
RBC: 4.94 MIL/uL (ref 4.22–5.81)
RDW: 13.2 % (ref 11.5–15.5)
WBC: 6 K/uL (ref 4.0–10.5)
nRBC: 0 % (ref 0.0–0.2)

## 2024-03-04 LAB — BASIC METABOLIC PANEL WITH GFR
Anion gap: 9 (ref 5–15)
BUN: 8 mg/dL (ref 6–20)
CO2: 27 mmol/L (ref 22–32)
Calcium: 9.1 mg/dL (ref 8.9–10.3)
Chloride: 99 mmol/L (ref 98–111)
Creatinine, Ser: 0.93 mg/dL (ref 0.61–1.24)
GFR, Estimated: >60 mL/min (ref >60)
Glucose, Bld: 112 mg/dL — ABNORMAL HIGH (ref 70–99)
Potassium: 3.6 mmol/L (ref 3.5–5.1)
Sodium: 135 mmol/L (ref 135–145)

## 2024-03-04 LAB — URINALYSIS, COMPLETE (UACMP) WITH MICROSCOPIC
Bilirubin Urine: NEGATIVE
Glucose, UA: NEGATIVE mg/dL
Hgb urine dipstick: NEGATIVE
Ketones, ur: NEGATIVE mg/dL
Leukocytes,Ua: NEGATIVE
Nitrite: NEGATIVE
Protein, ur: NEGATIVE mg/dL
Specific Gravity, Urine: >1.046 — ABNORMAL HIGH (ref 1.005–1.030)
pH: 7 (ref 5.0–8.0)

## 2024-03-04 LAB — RAPID URINE DRUG SCREEN, HOSP PERFORMED
Amphetamines: NOT DETECTED
Barbiturates: NOT DETECTED
Benzodiazepines: NOT DETECTED
Cocaine: NOT DETECTED
Opiates: NOT DETECTED
Tetrahydrocannabinol: NOT DETECTED

## 2024-03-04 LAB — HIV ANTIBODY (ROUTINE TESTING W REFLEX): HIV Screen 4th Generation wRfx: NONREACTIVE

## 2024-03-04 LAB — ETHANOL: Alcohol, Ethyl (B): <15 mg/dL (ref <15)

## 2024-03-04 LAB — AMMONIA: Ammonia: 31 umol/L (ref 9–35)

## 2024-03-04 LAB — PROTIME-INR
INR: 1.1 (ref 0.8–1.2)
Prothrombin Time: 14.5 s (ref 11.4–15.2)

## 2024-03-04 LAB — TSH: TSH: 0.832 u[IU]/mL (ref 0.350–4.500)

## 2024-03-04 MED ORDER — ACETAMINOPHEN 325 MG PO TABS: 650.0000 mg | ORAL_TABLET | Freq: Four times a day (QID) | ORAL | Status: DC | PRN

## 2024-03-04 MED ORDER — IOHEXOL 350 MG/ML SOLN
100.0000 mL | Freq: Once | INTRAVENOUS | Status: AC | PRN
  Administered 2024-03-04: 100 mL via INTRAVENOUS

## 2024-03-04 MED ORDER — ONDANSETRON HCL 4 MG/2ML IJ SOLN: 4.0000 mg | Freq: Four times a day (QID) | INTRAMUSCULAR | Status: DC | PRN

## 2024-03-04 MED ORDER — ONDANSETRON HCL 4 MG PO TABS: 4.0000 mg | ORAL_TABLET | Freq: Four times a day (QID) | ORAL | Status: DC | PRN

## 2024-03-04 MED ORDER — ENOXAPARIN SODIUM 40 MG/0.4ML IJ SOSY
40.0000 mg | PREFILLED_SYRINGE | INTRAMUSCULAR | Status: DC
  Administered 2024-03-04: 40 mg via SUBCUTANEOUS
  Filled 2024-03-04 (×2): qty 0.4

## 2024-03-04 MED ORDER — ACETAMINOPHEN 650 MG RE SUPP: 650.0000 mg | Freq: Four times a day (QID) | RECTAL | Status: DC | PRN

## 2024-03-04 MED ORDER — GADOBUTROL 1 MMOL/ML IV SOLN
10.0000 mL | Freq: Once | INTRAVENOUS | Status: AC | PRN
  Administered 2024-03-04: 10 mL via INTRAVENOUS

## 2024-03-04 MED ORDER — SODIUM CHLORIDE 0.9% FLUSH
3.0000 mL | Freq: Two times a day (BID) | INTRAVENOUS | Status: DC
  Administered 2024-03-04 (×2): 3 mL via INTRAVENOUS

## 2024-03-04 NOTE — H&P (Signed)
 History and Physical  Patient: [name] FMW:[id number] DOB: [birth year] DOA: 03/03/2024 DOS: the patient was seen and examined on 03/04/2024 Patient coming from: Home  Chief Complaint: Dizziness and confusion.   HPI: Patient with no significant PMH other than class II obesity presented to the hospital with complaints of dizziness and confusion as well as passing out episode. 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 Saturday 11/15 patient has not been feeling himself and feeling fatigue and tired. Also had some nausea and vomiting episode. After that episode he passed out and found himself on the floor.  He was so tired he went to sleep and he reports that he laid in the bed for 2 days.  He felt somewhat better and was driving on Monday 88/82 and then the next and he remembers his a lady helping to have him getting up.  He was taken to ER in [facility] .  He reports they took a lot of blood and said that he is fine and he can go home. His company brought him back. ED provider had access to paperwork where he had a CT head C-spine and chest abdomen pelvis reportedly without any acute findings. I am unable to access this paperwork. After arriving back in [place] on he continues to feel fatigue and tired.  He also thinks that his mentation was not at his baseline and appetite was low.  And therefore on 11/19 his family brought him to the hospital for further workup and evaluation. At the time of my evaluation on 11/20 at [facility], ER he thinks he is feeling better.  He thinks he is at his baseline.  Denies any headache no dizziness.  No chest pain.  No abdominal pain.  No nausea no vomiting. He reports that he has some discomfort in his legs in the calf muscle area intermittently. He reports that his legs intermittently will get stiff and swollen. He also reports that intermittently he has some left-sided chest pain. No fever no chills no nausea no  vomiting or diarrhea reported by the patient. No bleeding reported with the patient. Does not take any medication at his baseline. Denies any substance abuse or alcohol abuse. Does not smoke.  Assessment and plan. Syncope and collapse. Presents with 2 episodes of syncope. Currently no focal deficit. Reportedly CT head was unremarkable at outside facility. MRI brain with and without contrast here is negative for any acute abnormality, no stroke no mass no tumor. EEG is currently ordered and pending. At the time of my evaluation currently has no neurodeficit. Less likely this is a neurological event. Vasovagal event or cardiac event cannot be ruled out. 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 Will check lower extremity Doppler, CT PE protocol. Also check echocardiogram. Recommend to monitor on telemetry. Given frequency of the event may benefit from outpatient Zio patch as well. Continue IV fluid for now. Check orthostatic vitals.  Class II obesity. Body mass index is 38.52 kg/m.  Placing the patient at a higher to poor outcome.  Elevated LFT. Follows up with PCP outpatient.  Was told that he may have some fatty liver. Follow-up closely outpatient.  Reported confusion. Intractable nausea and vomiting. Patient reports that he has some intermittent confusion at home which is another reason for him to come to the  hospital. He reports also he has significant episodes of nausea and vomiting with minimal oral intake for last few days. Currently no evidence of dehydration clinically. Check metabolic workup for encephalopathy including ammonia level, TSH, HIV, UDS, urinalysis and ethanol level. EEG pending.  Advance Care Planning:   Code Status: Full Code  Consults: none  Prior to Admission medications   Medication Sig Start  Date End Date Taking? Authorizing Provider  EPINEPHrine  0.3 mg/0.3 mL IJ SOAJ injection Inject 0.3 mg into the muscle as needed for anaphylaxis. 04/29/23  Yes [name], [name], MD  meloxicam  (MOBIC ) 15 MG tablet TAKE 1 TABLET(15 MG) BY MOUTH DAILY 10/13/23  Yes [name], NP  OVER THE COUNTER MEDICATION Take 1 tablet by mouth daily. Day by day   Yes [provider]    Past Medical History:  Diagnosis Date   Arthritis    Gout    NGU (nongonococcal urethritis)    Raynaud's disease    Skin infection    History reviewed. No pertinent surgical history. Social History:  reports that he has quit smoking. His smoking use included cigarettes. He started smoking about 21 years ago. He has never been exposed to tobacco smoke. He has never used smokeless tobacco. He reports current alcohol use of about 2.0 standard drinks of alcohol per week. He reports that he does not currently use drugs after having used the following drugs: Marijuana. Frequency: 3.00 times per week. Allergies  Allergen Reactions   Other Diarrhea and Nausea And Vomiting   Bee Venom    Family History  Problem Relation Age of Onset   Arthritis Mother    Allergic rhinitis Father    Arthritis Father    Diabetes Father    Lupus Sister    Diabetes Maternal Grandmother    Diabetes Paternal Grandmother    Physical Exam: Vitals:   03/04/24 0700 03/04/24 0900 03/04/24 1147 03/04/24 1150  BP: 123/81 (!) 124/100    Pulse: (!) 58 67    Resp: 16 19    Temp:   98.3 F (36.8 C) 98.2 F (36.8 C)  TempSrc:   Oral Oral  SpO2: 93% 95%    Weight:       General: in Mild distress, No Rash Cardiovascular: S1 and S2 Present, No Murmur Respiratory: Good respiratory effort, Bilateral Air entry present. No Crackles, No wheezes Abdomen: Bowel Sound present, No tenderness Extremities: trace edema Neuro: Alert and oriented x3, no new focal deficit  Normal finger-nose-finger test. No asterixis. No pronator drift.  Pupils are  equal and reactive to light.  No nystagmus.  No unilateral motor deficit.  No speech difficulty.  Data Reviewed: I have reviewed ED notes, Vitals, Lab results and outpatient records. Since last encounter, pertinent lab results CBC and BMP   . I have ordered test including CBC and BMP CT chest PE protocol, echocardiogram, lower extremity Doppler  . I have ordered imaging see above  .   Family Communication: No one at bedside  Author: Yetta Blanch, MD 03/04/2024 2:29 PM For on call review [email or web address].

## 2024-03-04 NOTE — ED Notes (Signed)
 [facility] states pt HR went to [facility] en route with them and states he gets blurred vision when episode comes on

## 2024-03-04 NOTE — Procedures (Signed)
 Patient Name: [name]  MRN: [id number]  Epilepsy Attending: Pastor Falling  Referring Physician/Provider: No ref. provider found      Date: 03/04/2024  Duration: 25 minutes   Patient history: 43 year old man with seizure vs. Syncope. EEG to evaluate for seizure  Level of alertness: Awake, drowsy  AEDs during EEG study: None   Technical aspects: This EEG study was done with scalp electrodes positioned according to the 10-20 International system of electrode placement. Electrical activity was reviewed with band pass filter of 1-70Hz , sensitivity of 7 uV/mm, display speed of 22mm/sec with a 60Hz  notched filter applied as appropriate. EEG data were recorded continuously and digitally stored.  Video monitoring was available and reviewed as appropriate.  Description: The posterior dominant rhythm consists of 10 Hz activity of moderate voltage (25-35 uV) seen predominantly in posterior head regions, symmetric and reactive to eye opening and eye closing. Drowsiness was characterized by attenuation of the posterior background rhythm. Sleep was not seen. Hyperventilation and photic stimulation were not performed.     ABNORMALITY -None   IMPRESSION: This study is within normal limits. No seizures or epileptiform discharges were seen throughout the recording. A normal interictal EEG does not exclude nor support the diagnosis of epilepsy.   Pastor Falling MD Neurology

## 2024-03-04 NOTE — ED Notes (Signed)
 [facility] at bedside

## 2024-03-04 NOTE — Plan of Care (Signed)

## 2024-03-04 NOTE — ED Provider Notes (Signed)
 Patient boarding in our freestanding ED, pending admission.  Have worked on facilitating the transfer to [facility], ED.  Evaluated the patient this morning, he is doing well.  Requested some apple juice.  Provided.   Mannie Pac T, DO 03/04/24 [phone]

## 2024-03-04 NOTE — ED Notes (Signed)
Handoff report given to [facility] 

## 2024-03-04 NOTE — ED Notes (Signed)
 Handoff report given to Ozell Ford charge at [facility] ER

## 2024-03-04 NOTE — ED Notes (Signed)
 Patient transported to MRI

## 2024-03-04 NOTE — Progress Notes (Signed)
 Called [facility] to transport the patient to [facility] for MRI--Dr. Tegeler is receiving

## 2024-03-04 NOTE — Progress Notes (Signed)
 Routine EEG completed, results pending Neurology review and interpretation

## 2024-03-05 ENCOUNTER — Observation Stay (HOSPITAL_BASED_OUTPATIENT_CLINIC_OR_DEPARTMENT_OTHER)

## 2024-03-05 ENCOUNTER — Observation Stay (HOSPITAL_COMMUNITY)

## 2024-03-05 ENCOUNTER — Observation Stay (HOSPITAL_BASED_OUTPATIENT_CLINIC_OR_DEPARTMENT_OTHER)
Admit: 2024-03-05 | Discharge: 2024-03-05 | Disposition: A | Discharge status: Home / Self Care | Attending: Student | Admitting: Student

## 2024-03-05 DIAGNOSIS — R55 Syncope and collapse: Secondary | ICD-10-CM

## 2024-03-05 DIAGNOSIS — R609 Edema, unspecified: Secondary | ICD-10-CM

## 2024-03-05 DIAGNOSIS — R42 Dizziness and giddiness: Secondary | ICD-10-CM

## 2024-03-05 LAB — CBC
HCT: 41.3 % (ref 39.0–52.0)
Hemoglobin: 13.4 g/dL (ref 13.0–17.0)
MCH: 27.2 pg (ref 26.0–34.0)
MCHC: 32.4 g/dL (ref 30.0–36.0)
MCV: 83.8 fL (ref 80.0–100.0)
Platelets: 151 K/uL (ref 150–400)
RBC: 4.93 MIL/uL (ref 4.22–5.81)
RDW: 13.4 % (ref 11.5–15.5)
WBC: 5.7 K/uL (ref 4.0–10.5)
nRBC: 0 % (ref 0.0–0.2)

## 2024-03-05 LAB — ECHOCARDIOGRAM COMPLETE
AR max vel: 3.11 cm2
AV Area VTI: 3.12 cm2
AV Area mean vel: 3.14 cm2
AV Mean grad: 7 mmHg
AV Peak grad: 13.2 mmHg
Ao pk vel: 1.82 m/s
Area-P 1/2: 3.01 cm2
Calc EF: 61.9 %
S' Lateral: 3.6 cm
Single Plane A2C EF: 64.5 %
Single Plane A4C EF: 62 %
Weight: 4881.87 [oz_av]

## 2024-03-05 LAB — BASIC METABOLIC PANEL WITH GFR
Anion gap: 12 (ref 5–15)
BUN: 10 mg/dL (ref 6–20)
CO2: 24 mmol/L (ref 22–32)
Calcium: 9 mg/dL (ref 8.9–10.3)
Chloride: 104 mmol/L (ref 98–111)
Creatinine, Ser: 0.91 mg/dL (ref 0.61–1.24)
GFR, Estimated: >60 mL/min (ref >60)
Glucose, Bld: 94 mg/dL (ref 70–99)
Potassium: 4.3 mmol/L (ref 3.5–5.1)
Sodium: 140 mmol/L (ref 135–145)

## 2024-03-05 LAB — GLUCOSE, CAPILLARY: Glucose-Capillary: 115 mg/dL — ABNORMAL HIGH (ref 70–99)

## 2024-03-05 NOTE — Discharge Instructions (Signed)
Use caution when using heavy equipment or power tools.  Avoid working on ladders or at heights.  Take showers instead of baths. Ensure the water temperature is not too high on the home water heater. Do not go swimming alone.  When caring for infants or small children, sit down when holding, feeding, or changing them to minimize risk of injury to the child in the event you have a seizure.  Maintain good sleep hygiene. Avoid alcohol.

## 2024-03-05 NOTE — Progress Notes (Signed)
 VASCULAR LAB    Bilateral lower extremity venous duplex has been performed.  See CV proc for preliminary results.   Manvir Thorson, RVT 03/05/2024, 1:07 PM

## 2024-03-05 NOTE — Discharge Summary (Signed)
 Physician Discharge Summary   Patient: [name] MRN: [id number] DOB: Apr 28, 1980  Admit date:     03/03/2024  Discharge date: 03/05/24  Discharge Physician: Yetta Blanch  PCP: [name], NP  Recommendations at discharge: Follow-up with PCP in 1 week.   Follow-up Information     [name], NP. Schedule an appointment as soon as possible for a visit in 1 week(s).   Specialties: Family Medicine, Emergency Medicine Contact information: [address] [place] [address] [phone]                Hospital Course: Patient with no significant PMH other than class II obesity presented to the hospital with complaints of dizziness and confusion as well as passing out episode.   Assessment and plan. Syncope and collapse. Presents with 2 episodes of syncope. Currently no focal deficit. Reportedly CT head was unremarkable at outside facility. MRI brain with and without contrast here is negative for any acute abnormality, no stroke no mass no tumor. EEG is currently ordered and pending. At the time of my evaluation currently has no neurodeficit. Less likely this is a neurological event. Vasovagal event or cardiac event cannot be ruled out. 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 CT PE protocol negative for PE. No events on telemetry.  Lower extremity Doppler negative for DVT. Echocardiogram shows preserved EF.  No valvular abnormality. No evidence of carotid bruit. Given frequency of the event may benefit from outpatient Zio patch as well. Arranged.  Patient will follow-up with outpatient cardiology as well.   Class II obesity. Body mass index is 38.52 kg/m.  Placing the patient at a higher to poor outcome.   Elevated LFT. Follows up with PCP outpatient.  Was told that he may have some  fatty liver. Follow-up closely outpatient.   Reported confusion. Intractable nausea and vomiting. Patient reports that he has some intermittent confusion at home which is another reason for him to come to the hospital. He reports also he has significant episodes of nausea and vomiting with minimal oral intake for last few days. Currently no evidence of dehydration clinically. Metabolic workup was negative for encephalopathy including ammonia level, TSH, HIV, UDS, urinalysis and ethanol level. EEG negative for any areas of epileptogenic foci or active seizures. Patient felt back to his baseline.  Consultants:  None  Procedures performed:  Echocardiogram. EKG. Lower extremity Doppler.  DISCHARGE MEDICATION: Allergies as of 03/05/2024       Reactions   Other Diarrhea, Nausea And Vomiting   Bee Venom         Medication List     TAKE these medications    EPINEPHrine  0.3 mg/0.3 mL Soaj injection Commonly known as: EPI-PEN Inject 0.3 mg into the muscle as needed for anaphylaxis.   meloxicam  15 MG tablet Commonly known as: MOBIC  TAKE 1 TABLET(15 MG) BY MOUTH DAILY   OVER THE COUNTER MEDICATION Take 1 tablet by mouth daily. Day by day       Disposition: Home Diet recommendation: Regular diet  Discharge Exam: Vitals:   03/05/24 0500 03/05/24 0540 03/05/24 0745 03/05/24 1137  BP:  138/86 (!) 146/88 (!) 151/68  Pulse:  (!) 57 63 (!) 58  Resp:   18 16  Temp:  98.2 F (36.8 C) (!) 97.4 F (36.3 C) 98 F (36.7  C)  TempSrc:      SpO2:  100% 97% 98%  Weight: (!) 138.4 kg      Clear to auscultation. Bowel sounds normal S1-S2 present.  No murmur. No edema of lower extremities. Pupils are equal and reactive to light.  Alert awake and and oriented x 3. No focal deficit.  Filed Weights   03/03/24 0915 03/05/24 0500  Weight: 136.1 kg (!) 138.4 kg   Condition at discharge: stable  The results of significant diagnostics from this hospitalization (including  imaging, microbiology, ancillary and laboratory) are listed below for reference.   Imaging Studies: VAS US  LOWER EXTREMITY VENOUS (DVT) Result Date: 03/05/2024  Lower Venous DVT Study Patient Name:  [name]  Date of Exam:   03/05/2024 Medical Rec #: [id number]      Accession #:    [id number] Date of Birth: Jul 25, 1980      Patient Gender: M Patient Age:   43 years Exam Location:  [facility] Procedure:      VAS US  LOWER EXTREMITY VENOUS (DVT) Referring Phys: Marquie Aderhold --------------------------------------------------------------------------------  Indications: Syncope, dizziness, confusion, and Edema.  Limitations: Edema and body habitus. Comparison Study: Prior negative bilateral LEV done 05/29/26 with reflux exam.                   Patient positive for reflux disease in the bilateral lower                   extremities. Performing Technologist: Alberta Lis RVS  Examination Guidelines: A complete evaluation includes B-mode imaging, spectral Doppler, color Doppler, and power Doppler as needed of all accessible portions of each vessel. Bilateral testing is considered an integral part of a complete examination. Limited examinations for reoccurring indications may be performed as noted. The reflux portion of the exam is performed with the patient in reverse Trendelenburg.  +---------+---------------+---------+-----------+----------+--------------+ RIGHT    CompressibilityPhasicitySpontaneityPropertiesThrombus Aging +---------+---------------+---------+-----------+----------+--------------+ CFV      Full           Yes      Yes                                 +---------+---------------+---------+-----------+----------+--------------+ SFJ      Full                                                        +---------+---------------+---------+-----------+----------+--------------+ FV Prox  Full                                                         +---------+---------------+---------+-----------+----------+--------------+ FV Mid   Full                                                        +---------+---------------+---------+-----------+----------+--------------+ FV DistalFull           Yes      Yes                                 +---------+---------------+---------+-----------+----------+--------------+  PFV      Full                                                        +---------+---------------+---------+-----------+----------+--------------+ POP      Full           Yes      Yes                                 +---------+---------------+---------+-----------+----------+--------------+ PTV      Full                                                        +---------+---------------+---------+-----------+----------+--------------+ PERO     Full                                                        +---------+---------------+---------+-----------+----------+--------------+   +---------+---------------+---------+-----------+----------+--------------+ LEFT     CompressibilityPhasicitySpontaneityPropertiesThrombus Aging +---------+---------------+---------+-----------+----------+--------------+ CFV      Full           Yes      Yes                                 +---------+---------------+---------+-----------+----------+--------------+ SFJ      Full                                                        +---------+---------------+---------+-----------+----------+--------------+ FV Prox  Full                                                        +---------+---------------+---------+-----------+----------+--------------+ FV Mid   Full                                                        +---------+---------------+---------+-----------+----------+--------------+ FV DistalFull           Yes      Yes                                  +---------+---------------+---------+-----------+----------+--------------+ PFV      Full                                                        +---------+---------------+---------+-----------+----------+--------------+  POP      Full           Yes      Yes                                 +---------+---------------+---------+-----------+----------+--------------+ PTV      Full                                                        +---------+---------------+---------+-----------+----------+--------------+ PERO     Full                                                        +---------+---------------+---------+-----------+----------+--------------+    Summary: RIGHT: - There is no evidence of deep vein thrombosis in the lower extremity.  - No cystic structure found in the popliteal fossa.  LEFT: - There is no evidence of deep vein thrombosis in the lower extremity.  - No cystic structure found in the popliteal fossa.  *See table(s) above for measurements and observations.    Preliminary    ECHOCARDIOGRAM COMPLETE Result Date: 03/05/2024    ECHOCARDIOGRAM REPORT   Patient Name:   [name] Date of Exam: 03/05/2024 Medical Rec #:  [id number]     Height:       74.0 in Accession #:    [id number]    Weight:       305.1 lb Date of Birth:  November 03, 1980     BSA:          2.601 m Patient Age:    43 years      BP:           146/88 mmHg Patient Gender: M             HR:           63 bpm. Exam Location:  Inpatient Procedure: 2D Echo, Cardiac Doppler and Color Doppler (Both Spectral and Color            Flow Doppler were utilized during procedure). Indications:    R55 Syncope  History:        Patient has no prior history of Echocardiogram examinations.                 Signs/Symptoms:Chest Pain, Syncope, Altered Mental Status and                 Dizziness/Lightheadedness.  Sonographer:    Ellouise Mose RDCS Referring Phys: [id number] [facility] M Ludella Pranger IMPRESSIONS  1. Left ventricular ejection fraction, by  estimation, is 60 to 65%. Left ventricular ejection fraction by 2D MOD biplane is 61.9 %. The left ventricle has normal function. The left ventricle has no regional wall motion abnormalities. The left ventricular internal cavity size was mildly dilated. There is mild left ventricular hypertrophy. Left ventricular diastolic parameters were normal.  2. Right ventricular systolic function is normal. The right ventricular size is normal.  3. Left atrial size was mildly dilated.  4. The mitral valve is abnormal. Trivial mitral valve regurgitation. No evidence of mitral stenosis.  5. The aortic valve is tricuspid. There  is mild calcification of the aortic valve. Aortic valve regurgitation is not visualized. Aortic valve sclerosis is present, with no evidence of aortic valve stenosis. FINDINGS  Left Ventricle: Left ventricular ejection fraction, by estimation, is 60 to 65%. Left ventricular ejection fraction by 2D MOD biplane is 61.9 %. The left ventricle has normal function. The left ventricle has no regional wall motion abnormalities. The left ventricular internal cavity size was mildly dilated. There is mild left ventricular hypertrophy. Left ventricular diastolic parameters were normal. Right Ventricle: The right ventricular size is normal. No increase in right ventricular wall thickness. Right ventricular systolic function is normal. Left Atrium: Left atrial size was mildly dilated. Right Atrium: Right atrial size was normal in size. Pericardium: There is no evidence of pericardial effusion. Mitral Valve: The mitral valve is abnormal. There is mild thickening of the mitral valve leaflet(s). There is mild calcification of the mitral valve leaflet(s). Mild mitral annular calcification. Trivial mitral valve regurgitation. No evidence of mitral valve stenosis. Tricuspid Valve: The tricuspid valve is normal in structure. Tricuspid valve regurgitation is trivial. No evidence of tricuspid stenosis. Aortic Valve: The aortic  valve is tricuspid. There is mild calcification of the aortic valve. Aortic valve regurgitation is not visualized. Aortic valve sclerosis is present, with no evidence of aortic valve stenosis. Aortic valve mean gradient measures 7.0 mmHg. Aortic valve peak gradient measures 13.2 mmHg. Aortic valve area, by VTI measures 3.12 cm. Pulmonic Valve: The pulmonic valve was normal in structure. Pulmonic valve regurgitation is not visualized. No evidence of pulmonic stenosis. Aorta: The aortic root and ascending aorta are structurally normal, with no evidence of dilitation. Venous: The right upper pulmonary vein is normal. IAS/Shunts: No atrial level shunt detected by color flow Doppler. Additional Comments: 3D was performed not requiring image post processing on an independent workstation and was indeterminate.  LEFT VENTRICLE PLAX 2D                        Biplane EF (MOD) LVIDd:         6.00 cm         LV Biplane EF:   Left LVIDs:         3.60 cm                          ventricular LV PW:         1.10 cm                          ejection LV IVS:        1.15 cm                          fraction by LVOT diam:     2.40 cm                          2D MOD LV SV:         114                              biplane is LV SV Index:   44                               61.9 %.  LVOT Area:     4.52 cm LV IVRT:       127 msec        Diastology                                LV e' medial:    7.83 cm/s                                LV E/e' medial:  10.8 LV Volumes (MOD)               LV e' lateral:   14.70 cm/s LV vol d, MOD    140.0 ml      LV E/e' lateral: 5.8 A2C: LV vol d, MOD    182.0 ml A4C: LV vol s, MOD    49.7 ml A2C: LV vol s, MOD    69.2 ml A4C: LV SV MOD A2C:   90.3 ml LV SV MOD A4C:   182.0 ml LV SV MOD BP:    98.9 ml RIGHT VENTRICLE             IVC RV S prime:     13.20 cm/s  IVC diam: 1.90 cm TAPSE (M-mode): 2.6 cm                             PULMONARY VEINS                             Diastolic Velocity: 60.30 cm/s                              S/D Velocity:       1.30                             Systolic Velocity:  80.70 cm/s LEFT ATRIUM             Index        RIGHT ATRIUM           Index LA diam:        3.90 cm 1.50 cm/m   RA Area:     17.90 cm LA Vol (A2C):   44.3 ml 17.03 ml/m  RA Volume:   42.80 ml  16.46 ml/m LA Vol (A4C):   45.0 ml 17.30 ml/m LA Biplane Vol: 44.4 ml 17.07 ml/m  AORTIC VALVE AV Area (Vmax):    3.11 cm AV Area (Vmean):   3.14 cm AV Area (VTI):     3.12 cm AV Vmax:           182.00 cm/s AV Vmean:          115.000 cm/s AV VTI:            0.365 m AV Peak Grad:      13.2 mmHg AV Mean Grad:      7.0 mmHg LVOT Vmax:         125.00 cm/s LVOT Vmean:        79.800 cm/s LVOT VTI:          0.252 m LVOT/AV VTI ratio: 0.69  AORTA Ao Root diam: 3.30 cm Ao Asc diam:  3.10 cm  MITRAL VALVE MV Area (PHT): 3.01 cm    SHUNTS MV Decel Time: 252 msec    Systemic VTI:  0.25 m MV E velocity: 84.65 cm/s  Systemic Diam: 2.40 cm MV A velocity: 63.15 cm/s MV E/A ratio:  1.34 Maude Emmer MD Electronically signed by Maude Emmer MD Signature Date/Time: 03/05/2024/11:04:51 AM    Final    EEG adult Result Date: 03/04/2024 Gregg Lek, MD     03/04/2024  6:50 PM Patient Name: [name] MRN: [id number] Epilepsy Attending: Lek Gregg Referring Physician/Provider: No ref. provider found     Date: 03/04/2024 Duration: 25 minutes Patient history: 43 year old man with seizure vs. Syncope. EEG to evaluate for seizure Level of alertness: Awake, drowsy AEDs during EEG study: None Technical aspects: This EEG study was done with scalp electrodes positioned according to the 10-20 International system of electrode placement. Electrical activity was reviewed with band pass filter of 1-70Hz , sensitivity of 7 uV/mm, display speed of 11mm/sec with a 60Hz  notched filter applied as appropriate. EEG data were recorded continuously and digitally stored.  Video monitoring was available and reviewed as appropriate. Description: The posterior  dominant rhythm consists of 10 Hz activity of moderate voltage (25-35 uV) seen predominantly in posterior head regions, symmetric and reactive to eye opening and eye closing. Drowsiness was characterized by attenuation of the posterior background rhythm. Sleep was not seen. Hyperventilation and photic stimulation were not performed.   ABNORMALITY -None IMPRESSION: This study is within normal limits. No seizures or epileptiform discharges were seen throughout the recording. A normal interictal EEG does not exclude nor support the diagnosis of epilepsy. Lek Gregg MD Neurology    CT Angio Chest Pulmonary Embolism (PE) W or WO Contrast Result Date: 03/04/2024 EXAM: CTA of the Chest with contrast for PE 03/04/2024 02:38:00 PM TECHNIQUE: CTA of the chest was performed with and without the administration of 100 mL of iohexol  (OMNIPAQUE ) 350 MG/ML injection. Multiplanar reformatted images are provided for review. MIP images are provided for review. Automated exposure control, iterative reconstruction, and/or weight based adjustment of the mA/kV was utilized to reduce the radiation dose to as low as reasonably achievable. COMPARISON: None available. CLINICAL HISTORY: Pulmonary embolism (PE) suspected, high prob. FINDINGS: PULMONARY ARTERIES: Pulmonary arteries are adequately opacified for evaluation. No pulmonary embolism. Main pulmonary artery is normal in caliber. MEDIASTINUM: Mild cardiomegaly. Minimal coronary artery calcification. A 1 cm partially calcified right thyroid  nodule is noted. There is no acute abnormality of the thoracic aorta. LYMPH NODES: No mediastinal, hilar or axillary lymphadenopathy. LUNGS AND PLEURA: Mild bibasilar subsegmental atelectasis is noted. A 5 mm nodule is noted in the left lung apex, best seen on image 26 of series 6. A 4 mm perifissural nodule is noted in the right middle lobe, best seen on image 70 of series 6. No pleural effusion or pneumothorax. UPPER ABDOMEN: Limited images of  the upper abdomen are unremarkable. SOFT TISSUES AND BONES: No acute bone or soft tissue abnormality. IMPRESSION: 1. No evidence of pulmonary embolism. 2. Mild cardiomegaly and minimal coronary artery calcification. 3. Incidental 1 cm right thyroid  nodule. 4. Bilateral pulmonary nodules are noted, the largest measuring 5 mm. Follow-up unenhanced chest CT in 12 months is recommended. 5. Mild bibasilar subsegmental atelectasis. Electronically signed by: Lynwood Seip MD 03/04/2024 03:17 PM EST RP Workstation: [id number]   MR Brain W and Wo Contrast Result Date: 03/04/2024 EXAM: MRI BRAIN WITH AND WITHOUT CONTRAST 03/04/2024 12:52:37 PM TECHNIQUE: Multiplanar multisequence MRI of the head/brain was  performed with and without the administration of 10 mL (gadobutrol  (GADAVIST ) 1 MMOL/ML injection 10 mL GADOBUTROL  1 MMOL/ML IV SOLN) intravenous contrast. COMPARISON: CT head 10/22/2021. CLINICAL HISTORY: Seizure, new-onset, no history of trauma. FINDINGS: BRAIN AND VENTRICLES: The mesial temporal lobes and hippocampi are symmetric with preserved internal architecture of the hippocampi. There is no signal abnormality within the temporal lobes. The fornices are symmetric. No abnormal intracranial enhancement. No acute infarct. No acute intracranial hemorrhage. No mass effect or midline shift. No hydrocephalus. The sella is unremarkable. Normal flow voids. No mass. ORBITS: No acute abnormality. SINUSES: Mucosal thickening in the inferior aspect of the right maxillary sinus. BONES AND SOFT TISSUES: Normal bone marrow signal and enhancement. No acute soft tissue abnormality. IMPRESSION: 1. No acute intracranial abnormality to account for new-onset seizure. 2. No abnormal intracranial enhancement. Electronically signed by: Donnice Mania MD 03/04/2024 01:16 PM EST RP Workstation: [id number]    Microbiology: Results for orders placed or performed in visit on 03/18/23  WOUND CULTURE     Status: None   Collection Time:  03/18/23 11:31 AM   Specimen: Wound  Result Value Ref Range Status   MICRO NUMBER: [id number]  Final   SPECIMEN QUALITY: Adequate  Final   SOURCE: NOT GIVEN  Final   STATUS: FINAL  Final   GRAM STAIN:   Final    No epithelial cells seen No white blood cells seen Moderate Gram negative bacilli Few Gram positive bacilli   RESULT:   Final    A mix of organisms of questionable significance was recovered on culture and not further identified. (Note: Growth did not detect the presence of S.aureus, beta-hemolytic Streptococci or P.aeruginosa).   COMMENT:   Final    No source was provided. The specimen was tested and reported based upon the test code ordered. If this is incorrect, please contact client services.   Labs: CBC: Recent Labs  Lab 03/03/24 0958 03/04/24 1506 03/05/24 0538  WBC 6.7 6.0 5.7  NEUTROABS 4.2  --   --   HGB 13.8 13.3 13.4  HCT 42.6 41.6 41.3  MCV 84.4 84.2 83.8  PLT 165 184 151   Basic Metabolic Panel: Recent Labs  Lab 03/03/24 0958 03/04/24 1506 03/05/24 0538  NA 141 135 140  K 3.8 3.6 4.3  CL 104 99 104  CO2 29 27 24   GLUCOSE 129* 112* 94  BUN 10 8 10   CREATININE 0.92 0.93 0.91  CALCIUM 9.7 9.1 9.0   Liver Function Tests: Recent Labs  Lab 03/03/24 0958  AST 47*  ALT 95*  ALKPHOS 44  BILITOT 0.6  PROT 7.3  ALBUMIN 3.9   CBG: Recent Labs  Lab 03/05/24 0539  GLUCAP 115*    Discharge time spent: greater than 30 minutes.  Author: Yetta Blanch, MD  [facility] 03/05/2024

## 2024-03-05 NOTE — Progress Notes (Signed)
  Echocardiogram 2D Echocardiogram has been performed.  [name] 03/05/2024, 9:52 AM

## 2024-03-05 NOTE — Progress Notes (Signed)
   Ordered 2 week live Zio monitor to be placed prior to discharge for further evaluation of syncope. Dr. Pietro to read. Patient has never been seen by our office, so we will also arrange a New Patient Visit in our office to review results.  Sanaiyah Kirchhoff E Tailor Westfall, PA-C 03/05/2024 12:55 PM

## 2024-03-08 ENCOUNTER — Telehealth: Payer: Self-pay

## 2024-03-08 NOTE — Transitions of Care (Post Inpatient/ED Visit) (Signed)
   03/08/2024  Name: [name] MRN: [id number] DOB: [birth year]  Today's TOC FU Call Status: Today's TOC FU Call Status:: Successful TOC FU Call Completed TOC FU Call Complete Date: 03/08/24  Patient's Name and Date of Birth confirmed. DOB, Name  Transition Care Management Follow-up Telephone Call Date of Discharge: 03/05/24 Discharge Facility: [name] Type of Discharge: Inpatient Admission Primary Inpatient Discharge Diagnosis:: Syncope How have you been since you were released from the hospital?: Better Any questions or concerns?: Yes Patient Questions/Concerns:: Asking for release to return to work Patient Questions/Concerns Addressed: Notified Provider of Patient Questions/Concerns  Items Reviewed: Did you receive and understand the discharge instructions provided?: Yes Medications obtained,verified, and reconciled?: Yes (Medications Reviewed) Any new allergies since your discharge?: No Dietary orders reviewed?: NA Do you have support at home?: Yes People in Home [RPT]: significant other  Medications Reviewed Today: Medications Reviewed Today     Reviewed by Robert Robert NOVAK, LPN (Licensed Practical Nurse) on 03/08/24 at 1408  Med List Status: <None>   Medication Order Taking? Sig Documenting Provider Last Dose Status Informant  EPINEPHrine  0.3 mg/0.3 mL IJ SOAJ injection [id number] No Inject 0.3 mg into the muscle as needed for anaphylaxis. [name], Eric J, MD Unknown Active Self  meloxicam  (MOBIC ) 15 MG tablet [id number] No TAKE 1 TABLET(15 MG) BY MOUTH DAILY Almarie Robert NOVAK, Vasquez Past Week Active Self  OVER THE COUNTER MEDICATION [id number] No Take 1 tablet by mouth daily. Day by day [provider] 02/29/2024 Active Self            Home Care and Equipment/Supplies: Were Home Health Services Ordered?: NA Any new equipment or medical supplies ordered?: NA  Functional Questionnaire: Do you need assistance with bathing/showering or dressing?: No Do  you need assistance with meal preparation?: No Do you need assistance with eating?: No Do you have difficulty maintaining continence: No Do you need assistance with getting out of bed/getting out of a chair/moving?: No Do you have difficulty managing or taking your medications?: No  Follow up appointments reviewed: PCP Follow-up appointment confirmed?: Yes Date of PCP follow-up appointment?: 03/15/24 Follow-up Provider: Waddell Almarie Vasquez Specialist Hospital Follow-up appointment confirmed?: Yes Date of Specialist follow-up appointment?: 03/29/24 Follow-Up Specialty Provider:: cardiology Do you need transportation to your follow-up appointment?: No Do you understand care options if your condition(s) worsen?: Yes-patient verbalized understanding    SIGNATURE Robert Lavelle, LPN [facility] Health Advisor [facility] [facility] You Are. We Are. [facility] Direct Dial [phone]

## 2024-03-09 ENCOUNTER — Inpatient Hospital Stay: Admitting: Medical

## 2024-03-10 ENCOUNTER — Ambulatory Visit (HOSPITAL_BASED_OUTPATIENT_CLINIC_OR_DEPARTMENT_OTHER): Admission: RE | Admit: 2024-03-10 | Source: Ambulatory Visit

## 2024-03-12 ENCOUNTER — Telehealth: Payer: Self-pay

## 2024-03-12 NOTE — Telephone Encounter (Signed)
 I received a notification about a critical results from Mr. Cornia iRhythm event monitor noting a 90 second run of A-fib with rates 169-180 around 9:42 PM Eastern time.  I called the patient and he said that he felt slightly lightheaded.  He has not had any more of these events.  We had a discussion about risks and benefits of going to the emergency room tonight versus waiting, and have agreed that if he continues to be asymptomatic throughout the night he can stay at home.  If he has recurrence of symptoms he will go to the emergency room.  Our team will reach out to him in the morning by phone.  Donley LOISE Devonshire, MD, PhD Cardiology

## 2024-03-13 ENCOUNTER — Telehealth: Payer: Self-pay | Admitting: Student

## 2024-03-13 NOTE — Telephone Encounter (Signed)
   Received signout from overnight fellow that patient had new onset atrial fibrillation noted on his live Zio monitor.  Patient has never been seen by our office but we helped arrange a live ZIO monitor when he was recently admitted for syncope.  We received notification from monitor company last night that patient had a 90 second episode of atrial fibrillation with average rate of 177 bpm.  The overnight fellow, called the patient and he reported having a little lightheadedness around the time of the arrhythmia but was asymptomatic at the time of the call.  He was advised to go to the ED if he had recurrent symptoms and advised him that the day team would call back today to check in on him.    I was able to review the EKG strips on the live [facility] APP and he did have an episode of narrow complex tachycardia with average rate of 177 bpm last night.  However, it looks very regular.  Reviewed with Dr. Kennyth who agreed.  It is difficult to say whether this is true atrial fibrillation or SVT.  Will wait for full monitor results.  Called to check in on patient this morning.  He denies any recurrent symptoms.  He states he was cleaning the house last night and was up and down a lot and likely dehydrated.  He attributes his lightheadedness to this.  He has not had any recurrent symptoms and is asymptomatic today. We reviewed what his monitor showed last night. Will hold off on starting any medications and wait for full monitor results. He already has an office visit on 03/29/2024. Reviewed ED precautions.  Elder Davidian E Naziya Hegwood, PA-C 03/13/2024 11:32 AM

## 2024-03-15 ENCOUNTER — Other Ambulatory Visit: Payer: Self-pay

## 2024-03-15 ENCOUNTER — Ambulatory Visit (HOSPITAL_BASED_OUTPATIENT_CLINIC_OR_DEPARTMENT_OTHER)
Admission: RE | Admit: 2024-03-15 | Discharge: 2024-03-15 | Disposition: A | Source: Ambulatory Visit | Attending: Family Medicine

## 2024-03-15 ENCOUNTER — Ambulatory Visit: Admitting: Family Medicine

## 2024-03-15 ENCOUNTER — Encounter: Payer: Self-pay | Admitting: Family Medicine

## 2024-03-15 VITALS — BP 137/69 | HR 79 | Ht 74.0 in | Wt 307.0 lb

## 2024-03-15 DIAGNOSIS — E041 Nontoxic single thyroid nodule: Secondary | ICD-10-CM | POA: Insufficient documentation

## 2024-03-15 DIAGNOSIS — R911 Solitary pulmonary nodule: Secondary | ICD-10-CM | POA: Insufficient documentation

## 2024-03-15 DIAGNOSIS — Z09 Encounter for follow-up examination after completed treatment for conditions other than malignant neoplasm: Secondary | ICD-10-CM

## 2024-03-15 DIAGNOSIS — R5383 Other fatigue: Secondary | ICD-10-CM

## 2024-03-15 DIAGNOSIS — R7303 Prediabetes: Secondary | ICD-10-CM

## 2024-03-15 DIAGNOSIS — R55 Syncope and collapse: Secondary | ICD-10-CM

## 2024-03-15 DIAGNOSIS — R748 Abnormal levels of other serum enzymes: Secondary | ICD-10-CM | POA: Diagnosis present

## 2024-03-15 DIAGNOSIS — G4739 Other sleep apnea: Secondary | ICD-10-CM

## 2024-03-15 LAB — CBC WITH DIFFERENTIAL/PLATELET
Basophils Absolute: 0 K/uL (ref 0.0–0.1)
Basophils Relative: 0.3 % (ref 0.0–3.0)
Eosinophils Absolute: 0.2 K/uL (ref 0.0–0.7)
Eosinophils Relative: 2.2 % (ref 0.0–5.0)
HCT: 41 % (ref 39.0–52.0)
Hemoglobin: 13.3 g/dL (ref 13.0–17.0)
Lymphocytes Relative: 25.3 % (ref 12.0–46.0)
Lymphs Abs: 1.9 K/uL (ref 0.7–4.0)
MCHC: 32.5 g/dL (ref 30.0–36.0)
MCV: 83.5 fl (ref 78.0–100.0)
Monocytes Absolute: 0.6 K/uL (ref 0.1–1.0)
Monocytes Relative: 8.2 % (ref 3.0–12.0)
Neutro Abs: 4.8 K/uL (ref 1.4–7.7)
Neutrophils Relative %: 64 % (ref 43.0–77.0)
Platelets: 170 K/uL (ref 150.0–400.0)
RBC: 4.91 Mil/uL (ref 4.22–5.81)
RDW: 13.7 % (ref 11.5–15.5)
WBC: 7.5 K/uL (ref 4.0–10.5)

## 2024-03-15 LAB — HEMOGLOBIN A1C: Hgb A1c MFr Bld: 6.2 % (ref 4.6–6.5)

## 2024-03-15 LAB — BASIC METABOLIC PANEL WITH GFR
BUN: 14 mg/dL (ref 6–23)
CO2: 34 meq/L — ABNORMAL HIGH (ref 19–32)
Calcium: 9.8 mg/dL (ref 8.4–10.5)
Chloride: 98 meq/L (ref 96–112)
Creatinine, Ser: 0.84 mg/dL (ref 0.40–1.50)
GFR: 108.36 mL/min (ref >60.00)
Glucose, Bld: 133 mg/dL — ABNORMAL HIGH (ref 70–99)
Potassium: 4 meq/L (ref 3.5–5.1)
Sodium: 138 meq/L (ref 135–145)

## 2024-03-15 NOTE — Progress Notes (Signed)
 Established Patient Office Visit  Subjective:  Patient ID: [name], male    DOB: 11-Jul-1980  Age: 43 y.o. MRN: [id number]  CC:  Chief Complaint  Patient presents with   Medical Management of Chronic Issues     HPI [name] is here for Hospital Follow-up. Seen 03/03/2024 due to dizziness and confusion associated with an episode of syncope. Brain MRI normal.  EEG negative.. CT negative for PE. Doppler US  negative for DVT. Echo with preserved EF and no valvular abnormality. Labs stable. He is wearing a Zio monitor. He is continuing with dizziness and fatigue. States he did have an episode last week where he was cleaning the house and got really dizzy and had to sit down - reports the heart monitor team called him to check on him because heart rate was in the 170's, but he is not certain if they said he was in Afib. He has follow-up scheduled with cardiology in 2 weeks (our last EKG in office was abnormal and referral was recommended, but he declined at the time, wanted to look around for providers first). We previously ordered Abd US  for elevated liver enzymes - he had this done today and we will wait for results.    CTA Chest 03/04/2024 IMPRESSION: 1. No evidence of pulmonary embolism. 2. Mild cardiomegaly and minimal coronary artery calcification. 3. Incidental 1 cm right thyroid  nodule. 4. Bilateral pulmonary nodules are noted, the largest measuring 5 mm. Follow-up unenhanced chest CT in 12 months is recommended. 5. Mild bibasilar subsegmental atelectasis.    STOP-BANG for SLEEP APNEA Do you Snore loudly? Yes Do you often feel Tired during day? Yes Has anyone Observed you stop breathing? Yes History of high blood Pressure? No BMI >35? Yes Age >50? No Neck circumference >16 in? Yes Gender male? Yes 5-8 = high risk 3-4 = intermediate 0-2 = low risk       Lab Results  Component Value Date   HGBA1C 6.0 08/04/2023     Past Medical History:  Diagnosis Date    Arthritis    Gout    NGU (nongonococcal urethritis)    Raynaud's disease    Skin infection     History reviewed. No pertinent surgical history.  Family History  Problem Relation Age of Onset   Arthritis Mother    Allergic rhinitis Father    Arthritis Father    Diabetes Father    Lupus Sister    Diabetes Maternal Grandmother    Diabetes Paternal Grandmother     Social History   Socioeconomic History   Marital status: Significant Other    Spouse name: Not on file   Number of children: Not on file   Years of education: Not on file   Highest education level: Not on file  Occupational History   Not on file  Tobacco Use   Smoking status: Former    Types: Cigarettes    Start date: 2004    Passive exposure: Never   Smokeless tobacco: Never  Vaping Use   Vaping status: Never Used  Substance and Sexual Activity   Alcohol use: Yes    Alcohol/week: 2.0 standard drinks of alcohol    Types: 2 Cans of beer per week    Comment: rarely   Drug use: Not Currently    Frequency: 3.0 times per week    Types: Marijuana   Sexual activity: Yes    Partners: Female    Birth control/protection: Condom  Other Topics Concern  Not on file  Social History Narrative   Not on file   Social Drivers of Health   Financial Resource Strain: Not on file  Food Insecurity: Patient Declined (03/04/2024)   Hunger Vital Sign    Worried About Running Out of Food in the Last Year: Patient declined    Ran Out of Food in the Last Year: Patient declined  Transportation Needs: Patient Declined (03/04/2024)   PRAPARE - Administrator, Civil Service (Medical): Patient declined    Lack of Transportation (Non-Medical): Patient declined  Physical Activity: Not on file  Stress: Not on file  Social Connections: Not on file  Intimate Partner Violence: Patient Declined (03/04/2024)   Humiliation, Afraid, Rape, and Kick questionnaire    Fear of Current or Ex-Partner: Patient declined     Emotionally Abused: Patient declined    Physically Abused: Patient declined    Sexually Abused: Patient declined    ROS All ROS negative except what is listed in the HPI.   Objective:   Today's Vitals: BP 137/69   Pulse 79   Ht 6' 2 (1.88 m)   Wt (!) 307 lb (139.3 kg)   SpO2 97%   BMI 39.42 kg/m   Physical Exam Vitals reviewed.  Constitutional:      Appearance: Normal appearance. He is obese.  Cardiovascular:     Rate and Rhythm: Normal rate and regular rhythm.     Heart sounds: Normal heart sounds.     Comments: Occasional irregular beat Pulmonary:     Effort: Pulmonary effort is normal.     Breath sounds: Normal breath sounds.  Abdominal:     Tenderness: There is no right CVA tenderness or left CVA tenderness.  Skin:    General: Skin is warm and dry.  Neurological:     Mental Status: He is alert and oriented to person, place, and time.  Psychiatric:        Mood and Affect: Mood normal.        Behavior: Behavior normal.        Thought Content: Thought content normal.        Judgment: Judgment normal.     EKG = NSR 77 bpm, no ischemic changes  CHADS-VASC score today = 0    Assessment & Plan:   Problem List Items Addressed This Visit       Active Problems   Syncope - Primary Hospital discharge follow-up   Recent syncope. Extensive hospital workup unremarkable. Heart monitor in place. Cardiology referral scheduled for 2 weeks. - Continue heart monitor  - Ensure follow-up with cardiology on December 15th. - Advised to go to the hospital if another episode occurs. - Performed EKG to check for irregularities - NSR today.  - Referral placed for sleep study.  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 Offered  US , but he wants to think about it.       Pulmonary nodule   Pt was unaware of nodules on recent CT. No significant history of smoking. He is aware we need to follow-up with CT in 1 year.       Relevant Orders   Ambulatory referral to Pulmonology   Other Visit Diagnoses  Fatigue, unspecified type     Sleep apnea-like behavior  Labs and referral placed for sleep study.   Relevant Orders   CBC with Differential/Platelet   Basic metabolic panel with GFR   EKG 87-Ozji (Completed) Ambulatory referral to Pulmonology      Prediabetes     Recheck A1c today.   Relevant Orders   Hemoglobin A1c                                Follow-up: Return for - pending results or sooner if needed.      Waddell FURY Almarie, DNP, FNP-C  I,Emily Lagle,acting as a neurosurgeon for Waddell KATHEE Almarie, NP.,have documented all relevant documentation on the behalf of Waddell KATHEE Almarie, NP.  I, Waddell KATHEE Almarie, NP, have reviewed all documentation for this visit. The documentation on 03/15/2024 for the exam, diagnosis, procedures, and orders are all accurate and complete.

## 2024-03-15 NOTE — Assessment & Plan Note (Signed)
 Recent syncope. Extensive hospital workup unremarkable. Heart monitor in place. Cardiology referral scheduled for 2 weeks. - Continue heart monitor  - Ensure follow-up with cardiology on December 15th. - Advised to go to the hospital if another episode occurs. - Performed EKG to check for irregularities - NSR today.  - Referral placed for sleep study.  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

## 2024-03-15 NOTE — Assessment & Plan Note (Signed)
 Pt was unaware of nodules on recent CT. No significant history of smoking. He is aware we need to follow-up with CT in 1 year.

## 2024-03-15 NOTE — Assessment & Plan Note (Signed)
 CT November 2025 with incidental 1 cm right thyroid  nodule. Offered US , but he wants to think about it.

## 2024-03-16 ENCOUNTER — Emergency Department (HOSPITAL_COMMUNITY)

## 2024-03-16 ENCOUNTER — Emergency Department (HOSPITAL_COMMUNITY)
Admission: EM | Admit: 2024-03-16 | Discharge: 2024-03-17 | Disposition: A | Attending: Emergency Medicine | Admitting: Emergency Medicine

## 2024-03-16 ENCOUNTER — Ambulatory Visit: Payer: Self-pay | Admitting: Family Medicine

## 2024-03-16 DIAGNOSIS — R55 Syncope and collapse: Secondary | ICD-10-CM

## 2024-03-16 DIAGNOSIS — R1110 Vomiting, unspecified: Secondary | ICD-10-CM

## 2024-03-16 LAB — CBC WITH DIFFERENTIAL/PLATELET
Abs Immature Granulocytes: 0.03 K/uL (ref 0.00–0.07)
Basophils Absolute: 0 K/uL (ref 0.0–0.1)
Basophils Relative: 0 %
Eosinophils Absolute: 0.3 K/uL (ref 0.0–0.5)
Eosinophils Relative: 3 %
HCT: 42.5 % (ref 39.0–52.0)
Hemoglobin: 13 g/dL (ref 13.0–17.0)
Immature Granulocytes: 0 %
Lymphocytes Relative: 33 %
Lymphs Abs: 2.6 K/uL (ref 0.7–4.0)
MCH: 26.7 pg (ref 26.0–34.0)
MCHC: 30.6 g/dL (ref 30.0–36.0)
MCV: 87.3 fL (ref 80.0–100.0)
Monocytes Absolute: 0.7 K/uL (ref 0.1–1.0)
Monocytes Relative: 9 %
Neutro Abs: 4.3 K/uL (ref 1.7–7.7)
Neutrophils Relative %: 55 %
Platelets: 173 K/uL (ref 150–400)
RBC: 4.87 MIL/uL (ref 4.22–5.81)
RDW: 13.4 % (ref 11.5–15.5)
WBC: 8 K/uL (ref 4.0–10.5)
nRBC: 0 % (ref 0.0–0.2)

## 2024-03-16 LAB — COMPREHENSIVE METABOLIC PANEL WITH GFR
ALT: 54 U/L — ABNORMAL HIGH (ref 0–44)
AST: 36 U/L (ref 15–41)
Albumin: 3.6 g/dL (ref 3.5–5.0)
Alkaline Phosphatase: 37 U/L — ABNORMAL LOW (ref 38–126)
Anion gap: 7 (ref 5–15)
BUN: 10 mg/dL (ref 6–20)
CO2: 33 mmol/L — ABNORMAL HIGH (ref 22–32)
Calcium: 9.2 mg/dL (ref 8.9–10.3)
Chloride: 100 mmol/L (ref 98–111)
Creatinine, Ser: 1.09 mg/dL (ref 0.61–1.24)
GFR, Estimated: >60 mL/min (ref >60)
Glucose, Bld: 131 mg/dL — ABNORMAL HIGH (ref 70–99)
Potassium: 3.9 mmol/L (ref 3.5–5.1)
Sodium: 140 mmol/L (ref 135–145)
Total Bilirubin: 0.3 mg/dL (ref 0.0–1.2)
Total Protein: 7.1 g/dL (ref 6.5–8.1)

## 2024-03-16 LAB — CBG MONITORING, ED: Glucose-Capillary: 135 mg/dL — ABNORMAL HIGH (ref 70–99)

## 2024-03-16 LAB — LIPASE, BLOOD: Lipase: 62 U/L — ABNORMAL HIGH (ref 11–51)

## 2024-03-16 MED ORDER — SODIUM CHLORIDE 0.9 % IV BOLUS
1000.0000 mL | Freq: Once | INTRAVENOUS | Status: AC
  Administered 2024-03-16: 1000 mL via INTRAVENOUS

## 2024-03-16 MED ORDER — ONDANSETRON 4 MG PO TBDP: 4.0000 mg | ORAL_TABLET | Freq: Three times a day (TID) | ORAL | 0 refills | Status: AC | PRN

## 2024-03-16 MED ORDER — IOHEXOL 350 MG/ML SOLN
75.0000 mL | Freq: Once | INTRAVENOUS | Status: AC | PRN
  Administered 2024-03-16: 75 mL via INTRAVENOUS

## 2024-03-16 MED ORDER — POLYETHYLENE GLYCOL 3350 17 GM/SCOOP PO POWD: 17.0000 g | Freq: Every day | ORAL | 0 refills | Status: AC

## 2024-03-16 NOTE — ED Triage Notes (Signed)
 Pt was talking to wife and passed out. 20 mins of no contact and wife found him acting off when she got home had vomiting as well. Where in heart monitor since 11/.21 is weak and states he vomited today before passing out.

## 2024-03-16 NOTE — ED Provider Notes (Signed)
 [place] EMERGENCY DEPARTMENT AT [facility] Provider Note   CSN: [id number] Arrival date & time: 03/16/24  2028     Patient presents with: Loss of Consciousness (Pt was talking to wife and passed out. 20 mins of no contact and wife found him acting off when she got home had vomiting as well. Where in heart monitor since 11/.21)   [name] is a 43 y.o. male.  Past medical history significant for syncope presents today for a syncopal episode of approximately 20 minutes.  Patient also reports right sided abdominal distention, fatigue, and an episode of emesis just prior to passing out.  Patient denies fever, chills, chest pain, shortness of breath, or any other complaints at this time.  Patient has been wearing a ZIO monitor and was notified on 11/29 for an episode of A-fib that had resolved without intervention.    Loss of Consciousness Associated symptoms: vomiting        Prior to Admission medications   Medication Sig Start Date End Date Taking? Authorizing Provider  ondansetron  (ZOFRAN -ODT) 4 MG disintegrating tablet Take 1 tablet (4 mg total) by mouth every 8 (eight) hours as needed for nausea or vomiting. 03/16/24  Yes Elverda Wendel N, PA-C  polyethylene glycol powder (GLYCOLAX /MIRALAX ) 17 GM/SCOOP powder Take 17 g by mouth daily. Dissolve 1 capful (17g) in 4-8 ounces of liquid and take by mouth daily. 03/16/24  Yes [name], PA-C  EPINEPHrine  0.3 mg/0.3 mL IJ SOAJ injection Inject 0.3 mg into the muscle as needed for anaphylaxis. 04/29/23   [name], [name], MD  meloxicam  (MOBIC ) 15 MG tablet TAKE 1 TABLET(15 MG) BY MOUTH DAILY 10/13/23   [name], NP    Allergies: Other and Bee venom    Review of Systems  Cardiovascular:  Positive for syncope.  Gastrointestinal:  Positive for vomiting.  Neurological:  Positive for syncope.    Updated Vital Signs BP 127/71   Pulse 69   Resp (!) 22   SpO2 100%   Physical Exam Vitals and nursing note reviewed.   Constitutional:      General: He is not in acute distress.    Appearance: He is well-developed. He is obese. He is not toxic-appearing.  HENT:     Head: Normocephalic and atraumatic.     Right Ear: External ear normal.     Left Ear: External ear normal.  Eyes:     Extraocular Movements: Extraocular movements intact.     Conjunctiva/sclera: Conjunctivae normal.  Cardiovascular:     Rate and Rhythm: Normal rate and regular rhythm.     Pulses: Normal pulses.     Heart sounds: Normal heart sounds. No murmur heard. Pulmonary:     Effort: Pulmonary effort is normal. No respiratory distress.     Breath sounds: Normal breath sounds.  Abdominal:     General: There is distension.     Palpations: Abdomen is soft.     Tenderness: There is no abdominal tenderness.  Musculoskeletal:        General: No swelling.     Cervical back: Neck supple.     Left lower leg: No edema.  Skin:    General: Skin is warm and dry.     Capillary Refill: Capillary refill takes less than 2 seconds.  Neurological:     General: No focal deficit present.     Mental Status: He is alert and oriented to person, place, and time.  Psychiatric:  Mood and Affect: Mood normal.     (all labs ordered are listed, but only abnormal results are displayed) Labs Reviewed  COMPREHENSIVE METABOLIC PANEL WITH GFR - Abnormal; Notable for the following components:      Result Value   CO2 33 (*)    Glucose, Bld 131 (*)    ALT 54 (*)    Alkaline Phosphatase 37 (*)    All other components within normal limits  LIPASE, BLOOD - Abnormal; Notable for the following components:   Lipase 62 (*)    All other components within normal limits  CBG MONITORING, ED - Abnormal; Notable for the following components:   Glucose-Capillary 135 (*)    All other components within normal limits  CBC WITH DIFFERENTIAL/PLATELET  URINALYSIS, ROUTINE W REFLEX MICROSCOPIC    EKG: EKG Interpretation Date/Time:  Tuesday March 16 2024  20:45:35 EST Ventricular Rate:  78 PR Interval:  171 QRS Duration:  109 QT Interval:  391 QTC Calculation: 446 R Axis:   -10  Text Interpretation: Sinus rhythm Probable left atrial enlargement Confirmed by Garrick Charleston [phone]) on 03/16/2024 10:52:39 PM  Radiology: CT ABDOMEN PELVIS W CONTRAST Result Date: 03/16/2024 EXAM: CT ABDOMEN AND PELVIS WITH CONTRAST 03/16/2024 10:27:31 PM TECHNIQUE: CT of the abdomen and pelvis was performed with the administration of 75 mL of iohexol  (OMNIPAQUE ) 350 MG/ML injection. Multiplanar reformatted images are provided for review. Automated exposure control, iterative reconstruction, and/or weight-based adjustment of the mA/kV was utilized to reduce the radiation dose to as low as reasonably achievable. COMPARISON: None available. CLINICAL HISTORY: Abdominal pain, acute, nonlocalized. FINDINGS: LOWER CHEST: Scarring in the lower lungs. LIVER: The liver is unremarkable. GALLBLADDER AND BILE DUCTS: Gallbladder is unremarkable. No biliary ductal dilatation. SPLEEN: No acute abnormality. PANCREAS: No acute abnormality. ADRENAL GLANDS: No acute abnormality. KIDNEYS, URETERS AND BLADDER: No stones in the kidneys or ureters. No hydronephrosis. No perinephric or periureteral stranding. Urinary bladder is unremarkable. GI AND BOWEL: Stomach demonstrates no acute abnormality. Moderate colonic stool burden compatible with constipation. Normal appendix. There is no bowel obstruction. PERITONEUM AND RETROPERITONEUM: No ascites. No free air. VASCULATURE: Aorta is normal in caliber. LYMPH NODES: No lymphadenopathy. REPRODUCTIVE ORGANS: No acute abnormality. BONES AND SOFT TISSUES: Heterotopic ossification about the left hip. No focal soft tissue abnormality. IMPRESSION: 1. No acute findings in the abdomen or pelvis. 2. Moderate colonic stool burden compatible with constipation. Electronically signed by: Norman Gatlin MD 03/16/2024 10:33 PM EST RP Workstation: [id number]   DG Chest  Port 1 View Result Date: 03/16/2024 CLINICAL DATA:  syncope EXAM: DG CHEST 1V PORT COMPARISON:  06/16/2006, 03/04/2024 FINDINGS: Low lung volumes. Streaky opacities in both lung bases, likely atelectasis. No focal airspace consolidation, pleural effusion, or pneumothorax. Mild cardiomegaly.Metallic structure overlying the left mid chest, possibly a cardiac loop recorder device.No acute fracture or destructive lesion. IMPRESSION: Low lung volumes with streaky bibasilar airspace opacities, likely atelectasis. Electronically Signed   By: Rogelia Myers M.D.   On: 03/16/2024 21:13     Procedures   Medications Ordered in the ED  sodium chloride  0.9 % bolus 1,000 mL (1,000 mLs Intravenous New Bag/Given 03/16/24 2116)  iohexol  (OMNIPAQUE ) 350 MG/ML injection 75 mL (75 mLs Intravenous Contrast Given 03/16/24 2228)                                    Medical Decision Making Amount and/or Complexity of Data Reviewed Labs: ordered. Radiology:  ordered.  Risk Prescription drug management.   This patient presents to the ED for concern of vomiting and syncope, this involves an extensive number of treatment options, and is a complaint that carries with it a high risk of complications and morbidity.  The differential diagnosis includes arrhythmia, anemia, electrolyte abnormality, vasovagal syncope, orthostatic hypotension, viral GI illness, appendicitis, pancreatitis, choledocholithiasis, acute cholecystitis, SBO, diverticulitis, volvulus   Additional history obtained:  Additional history obtained from EMR External records from outside source obtained and reviewed including previous admission notes   Lab Tests:  I Ordered, and personally interpreted labs.  The pertinent results include: CBC unremarkable, elevated CO2 at 33, decreased alk phos at 37, elevated lipase at 62, elevated ALT at 54,   Imaging Studies ordered:  I ordered imaging studies including CT abdomen pelvis with contrast I  independently visualized and interpreted imaging which showed no acute findings in the abdomen or pelvis.  Moderate colonic stool burden compatible with constipation. I agree with the radiologist interpretation Chest x-ray: Low lung volumes with streaky bibasilar air opacities, likely atelectasis   Cardiac Monitoring: / EKG:  The patient was maintained on a cardiac monitor.  I personally viewed and interpreted the cardiac monitored which showed an underlying rhythm of: Sinus rhythm, probable LAE   Problem List / ED Course / Critical interventions / Medication management  I ordered medication including IVF   Reevaluation of the patient after these medicines showed that the patient improvement in symptoms I have reviewed the patients home medicines and have made adjustments as needed Patient orthostatic negative  Test / Admission - Considered:  Consider for admission or further workup, however patient's vital signs, physical exam, labs, and imaging are reassuring.  Along with using shared decision making, patient will be discharged home with Zofran  for nausea and vomiting and MiraLAX  for mild constipation.  Patient has follow-up with cardiology on the 15th and will contact them in the morning in regards to if the patient was in an arrhythmia during the syncopal episode.  Patient given return precautions.  I feel patient is safe for discharge at this time.     Final diagnoses:  Syncope, unspecified syncope type  Vomiting, unspecified vomiting type, unspecified whether nausea present    ED Discharge Orders          Ordered    ondansetron  (ZOFRAN -ODT) 4 MG disintegrating tablet  Every 8 hours PRN        03/16/24 2351    polyethylene glycol powder (GLYCOLAX /MIRALAX ) 17 GM/SCOOP powder  Daily        03/16/24 2351               [name], PA-C 03/16/24 2353    Garrick Charleston, MD 03/23/24 [phone]

## 2024-03-16 NOTE — Discharge Instructions (Addendum)
 Today you were seen for a syncopal episode and vomiting.  I suspect you likely passed out from vasovagal syncope from bearing down while vomiting.  You appear to be slightly dehydrated.  Please maintain adequate oral hydration.  You were also found to be slightly constipated on imaging.  You have been prescribed Zofran  for nausea and vomiting and daily MiraLAX for mild constipation.  Please refrain from driving until you are cleared by cardiology for your syncopal episodes.  Thank you for letting us  treat you today. After reviewing your labs and imaging, I feel you are safe to go home. Please follow up with your PCP in the next several days and provide them with your records from this visit. Return to the Emergency Room if pain becomes severe or symptoms worsen.

## 2024-03-17 NOTE — ED Notes (Signed)
 Pt d/c home per EDP order. Discharge summary reviewed, verbalize understanding. NAD , reports visitor is discharge ride home.

## 2024-03-22 ENCOUNTER — Encounter: Payer: Self-pay | Admitting: Family Medicine

## 2024-03-26 DIAGNOSIS — R55 Syncope and collapse: Secondary | ICD-10-CM

## 2024-03-26 NOTE — Addendum Note (Signed)
 Encounter addended by: Homer Miller N on: 03/26/2024 9:50 AM  Actions taken: Imaging Exam ended

## 2024-03-27 ENCOUNTER — Ambulatory Visit: Payer: Self-pay | Admitting: Student

## 2024-03-29 ENCOUNTER — Encounter: Payer: Self-pay | Admitting: Family Medicine

## 2024-03-29 ENCOUNTER — Telehealth: Payer: Self-pay | Admitting: Nurse Practitioner

## 2024-03-29 ENCOUNTER — Ambulatory Visit: Attending: Nurse Practitioner | Admitting: Nurse Practitioner

## 2024-03-29 VITALS — BP 124/80 | HR 82 | Ht 75.0 in | Wt 299.0 lb

## 2024-03-29 DIAGNOSIS — R55 Syncope and collapse: Secondary | ICD-10-CM

## 2024-03-29 NOTE — Progress Notes (Unsigned)
 Office Visit    Patient Name: [name] Date of Encounter: 03/29/2024  Primary Care Provider:  [name], NP Primary Cardiologist:  Madonna Large, DO  Chief Complaint    43 year old male with a history of coronary artery calcification noted on CT, syncope, SVT, Raynaud's phenomenon, arthritis, and gout who presents for [facility] new patient evaluation.  Past Medical History    Past Medical History:  Diagnosis Date   Arthritis    Gout    NGU (nongonococcal urethritis)    Raynaud's disease    Skin infection    No past surgical history on file.  Allergies  Allergies[1]   Labs/Other Studies Reviewed    The following studies were reviewed today:  Cardiac Studies & Procedures   ______________________________________________________________________________________________     ECHOCARDIOGRAM  ECHOCARDIOGRAM COMPLETE 03/05/2024  Narrative ECHOCARDIOGRAM REPORT    Patient Name:   [name] Date of Exam: 03/05/2024 Medical Rec #:  [id number]     Height:       74.0 in Accession #:    [id number]    Weight:       305.1 lb Date of Birth:  04-11-1981     BSA:          2.601 m Patient Age:    43 years      BP:           146/88 mmHg Patient Gender: M             HR:           63 bpm. Exam Location:  Inpatient  Procedure: 2D Echo, Cardiac Doppler and Color Doppler (Both Spectral and Color Flow Doppler were utilized during procedure).  Indications:    R55 Syncope  History:        Patient has no prior history of Echocardiogram examinations. Signs/Symptoms:Chest Pain, Syncope, Altered Mental Status and Dizziness/Lightheadedness.  Sonographer:    Ellouise Mose RDCS Referring Phys: [id number] [facility] M PATEL  IMPRESSIONS   1. Left ventricular ejection fraction, by estimation, is 60 to 65%. Left ventricular ejection fraction by 2D MOD biplane is 61.9 %. The left ventricle has normal function. The left ventricle has no regional wall motion abnormalities. The  left ventricular internal cavity size was mildly dilated. There is mild left ventricular hypertrophy. Left ventricular diastolic parameters were normal. 2. Right ventricular systolic function is normal. The right ventricular size is normal. 3. Left atrial size was mildly dilated. 4. The mitral valve is abnormal. Trivial mitral valve regurgitation. No evidence of mitral stenosis. 5. The aortic valve is tricuspid. There is mild calcification of the aortic valve. Aortic valve regurgitation is not visualized. Aortic valve sclerosis is present, with no evidence of aortic valve stenosis.  FINDINGS Left Ventricle: Left ventricular ejection fraction, by estimation, is 60 to 65%. Left ventricular ejection fraction by 2D MOD biplane is 61.9 %. The left ventricle has normal function. The left ventricle has no regional wall motion abnormalities. The left ventricular internal cavity size was mildly dilated. There is mild left ventricular hypertrophy. Left ventricular diastolic parameters were normal.  Right Ventricle: The right ventricular size is normal. No increase in right ventricular wall thickness. Right ventricular systolic function is normal.  Left Atrium: Left atrial size was mildly dilated.  Right Atrium: Right atrial size was normal in size.  Pericardium: There is no evidence of pericardial effusion.  Mitral Valve: The mitral valve is abnormal. There is mild thickening of the mitral valve leaflet(s). There  is mild calcification of the mitral valve leaflet(s). Mild mitral annular calcification. Trivial mitral valve regurgitation. No evidence of mitral valve stenosis.  Tricuspid Valve: The tricuspid valve is normal in structure. Tricuspid valve regurgitation is trivial. No evidence of tricuspid stenosis.  Aortic Valve: The aortic valve is tricuspid. There is mild calcification of the aortic valve. Aortic valve regurgitation is not visualized. Aortic valve sclerosis is present, with no evidence of  aortic valve stenosis. Aortic valve mean gradient measures 7.0 mmHg. Aortic valve peak gradient measures 13.2 mmHg. Aortic valve area, by VTI measures 3.12 cm.  Pulmonic Valve: The pulmonic valve was normal in structure. Pulmonic valve regurgitation is not visualized. No evidence of pulmonic stenosis.  Aorta: The aortic root and ascending aorta are structurally normal, with no evidence of dilitation.  Venous: The right upper pulmonary vein is normal.  IAS/Shunts: No atrial level shunt detected by color flow Doppler.  Additional Comments: 3D was performed not requiring image post processing on an independent workstation and was indeterminate.   LEFT VENTRICLE PLAX 2D                        Biplane EF (MOD) LVIDd:         6.00 cm         LV Biplane EF:   Left LVIDs:         3.60 cm                          ventricular LV PW:         1.10 cm                          ejection LV IVS:        1.15 cm                          fraction by LVOT diam:     2.40 cm                          2D MOD LV SV:         114                              biplane is LV SV Index:   44                               61.9 %. LVOT Area:     4.52 cm LV IVRT:       127 msec        Diastology LV e' medial:    7.83 cm/s LV E/e' medial:  10.8 LV Volumes (MOD)               LV e' lateral:   14.70 cm/s LV vol d, MOD    140.0 ml      LV E/e' lateral: 5.8 A2C: LV vol d, MOD    182.0 ml A4C: LV vol s, MOD    49.7 ml A2C: LV vol s, MOD    69.2 ml A4C: LV SV MOD A2C:   90.3 ml LV SV MOD A4C:   182.0 ml LV SV MOD BP:    98.9 ml  RIGHT VENTRICLE  IVC RV S prime:     13.20 cm/s  IVC diam: 1.90 cm TAPSE (M-mode): 2.6 cm PULMONARY VEINS Diastolic Velocity: 60.30 cm/s S/D Velocity:       1.30 Systolic Velocity:  80.70 cm/s  LEFT ATRIUM             Index        RIGHT ATRIUM           Index LA diam:        3.90 cm 1.50 cm/m   RA Area:     17.90 cm LA Vol (A2C):   44.3 ml 17.03 ml/m  RA Volume:    42.80 ml  16.46 ml/m LA Vol (A4C):   45.0 ml 17.30 ml/m LA Biplane Vol: 44.4 ml 17.07 ml/m AORTIC VALVE AV Area (Vmax):    3.11 cm AV Area (Vmean):   3.14 cm AV Area (VTI):     3.12 cm AV Vmax:           182.00 cm/s AV Vmean:          115.000 cm/s AV VTI:            0.365 m AV Peak Grad:      13.2 mmHg AV Mean Grad:      7.0 mmHg LVOT Vmax:         125.00 cm/s LVOT Vmean:        79.800 cm/s LVOT VTI:          0.252 m LVOT/AV VTI ratio: 0.69  AORTA Ao Root diam: 3.30 cm Ao Asc diam:  3.10 cm  MITRAL VALVE MV Area (PHT): 3.01 cm    SHUNTS MV Decel Time: 252 msec    Systemic VTI:  0.25 m MV E velocity: 84.65 cm/s  Systemic Diam: 2.40 cm MV A velocity: 63.15 cm/s MV E/A ratio:  1.34  Maude Emmer MD Electronically signed by Maude Emmer MD Signature Date/Time: 03/05/2024/11:04:51 AM    Final    MONITORS  LONG TERM MONITOR-LIVE TELEMETRY (3-14 DAYS) 03/26/2024  Narrative Patch Wear Time:  12 days and 18 hours (2025-11-21T14:30:59-0500 to 2025-12-04T09:22:14-0500)  Patient had a min HR of 45 bpm, max HR of 231 bpm, and avg HR of 82 bpm. Predominant underlying rhythm was Sinus Rhythm. Slight P wave morphology changes were noted. 2 Supraventricular Tachycardia runs occurred, the run with the fastest interval lasting 4 beats with a max rate of 231 bpm, the longest lasting 4 beats with an avg rate of 128 bpm. Isolated SVEs were rare (<1.0%), SVE Couplets were rare (<1.0%), and SVE Triplets were rare (<1.0%). Isolated VEs were rare (<1.0%), VE Couplets were rare (<1.0%), and no VE Triplets were present. Ventricular Bigeminy was present.  Sinus bradycardia, NSR sinus tachycardia, rare PAC, 2 four beat runs of SVT and rare PVC and couplet. Symptoms associated with NSR and sinus tachycardia Redell Shallow       ______________________________________________________________________________________________     Recent Labs: 03/04/2024: TSH 0.832 03/16/2024: ALT 54; BUN  10; Creatinine, Ser 1.09; Hemoglobin 13.0; Platelets 173; Potassium 3.9; Sodium 140  Recent Lipid Panel    Component Value Date/Time   CHOL 149 02/13/2023 1504   TRIG 174.0 (H) 02/13/2023 1504   HDL 28.20 (L) 02/13/2023 1504   CHOLHDL 5 02/13/2023 1504   VLDL 34.8 02/13/2023 1504   LDLCALC 86 02/13/2023 1504    History of Present Illness    43 year old male with the above past medical history including minimal coronary artery calcification noted on  CT, syncope, SVT, prediabetes, Raynaud's phenomenon, thyroid  nodule, pulmonary nodules, arthritis, hepatic steatosis, and gout.  He was hospitalized in November 2025 in the setting of syncope. CT of the head, MRI of the brain, EEG were unremarkable. CT angio chest was negative for PE, mild cardiomegaly and minimal coronary artery calcification was noted, incidental 1 cm right thyroid  nodule, bilateral pulmonary nodules, largest measuring 5 mm.  Follow-up CT chest was recommended in 12 months. Lower extremity duplex was negative for DVT.  Echocardiogram showed EF 60 to 65% normal LV function, no RWMA, mild LVH, normal RV systolic function, aortic valve sclerosis with no evidence of aortic valve stenosis.  He returned to the ED on 03/16/2024 with recurrent syncope. Orthostatics were negative.  Chest x-ray was unremarkable. CT of the abdomen pelvis showed moderate colonic stool burden compatible with constipation. Abdominal ultrasound revealed hepatic steatosis. 14-day ZIO monitor showed 2 brief runs of SVT, rare PACs and PVCs, no significant arrhythmia. He was referred to cardiology for ongoing management.  He presents today for [facility] clinic new patient evaluation accompanied by his fiance and fianc's son.  He reports a nearly 1 month history of intermittent syncope, unclear whether or not this was true syncope with loss of consciousness.  He works as a naval architect, he was on the road for several days he felt sick.  Upon returning, he noticed low  energy.  He spent days in bed.  1 month prior he had started drinking caffeine regularly for the first time, a mushroom coffee from [facility].  He states he had a syncopal episode on 02/28/2024, and again on 03/01/2024.  He was driving and his eyes were blurry and he passed out.  He states his mouth felt dry, like sandpaper at that time.  He reports recurrent syncope on 03/12/2024 and 03/16/2024.  ED workup as above.  He denies any recurrent syncope.  He has stopped drinking caffeine, has increased his fluid intake.  Pending sleep study per PCP.  He denies any recent drug use, denies tobacco use.  He drinks socially on the weekends.  He does not exercise regularly.   Home Medications    Current Outpatient Medications  Medication Sig Dispense Refill   Melatonin 5 MG CHEW Chew 2 Pieces of gum by mouth at bedtime.     Multiple Vitamin (MULTIVITAMIN) tablet Take 1 tablet by mouth daily.     omeprazole (PRILOSEC OTC) 20 MG tablet Take 20 mg by mouth daily.     EPINEPHrine  0.3 mg/0.3 mL IJ SOAJ injection Inject 0.3 mg into the muscle as needed for anaphylaxis. 1 each 1   meloxicam  (MOBIC ) 15 MG tablet TAKE 1 TABLET(15 MG) BY MOUTH DAILY 30 tablet 0   ondansetron  (ZOFRAN -ODT) 4 MG disintegrating tablet Take 1 tablet (4 mg total) by mouth every 8 (eight) hours as needed for nausea or vomiting. 20 tablet 0   polyethylene glycol powder (GLYCOLAX /MIRALAX ) 17 GM/SCOOP powder Take 17 g by mouth daily. Dissolve 1 capful (17g) in 4-8 ounces of liquid and take by mouth daily. 238 g 0   No current facility-administered medications for this visit.     Review of Systems    He denies chest pain, palpitations, dyspnea, pnd, orthopnea, n, v, dizziness, syncope, edema, weight gain, or early satiety. All other systems reviewed and are otherwise negative except as noted above.   Physical Exam    VS:  BP 124/80   Pulse 82   Ht 6' 3 (1.905 m)   Wt 299  lb (135.6 kg)   SpO2 98%   BMI 37.37 kg/m   GEN: Well  nourished, well developed, in no acute distress. HEENT: normal. Neck: Supple, no JVD, carotid bruits, or masses. Cardiac: RRR, no murmurs, rubs, or gallops. No clubbing, cyanosis, edema.  Radials/DP/PT 2+ and equal bilaterally.  Respiratory:  Respirations regular and unlabored, clear to auscultation bilaterally. GI: Soft, nontender, nondistended, BS + x 4. MS: no deformity or atrophy. Skin: warm and dry, no rash. Neuro:  Strength and sensation are intact. Psych: Normal affect.  Accessory Clinical Findings    ECG personally reviewed by me today -    - no EKG in office today.    Lab Results  Component Value Date   WBC 8.0 03/16/2024   HGB 13.0 03/16/2024   HCT 42.5 03/16/2024   MCV 87.3 03/16/2024   PLT 173 03/16/2024   Lab Results  Component Value Date   CREATININE 1.09 03/16/2024   BUN 10 03/16/2024   NA 140 03/16/2024   K 3.9 03/16/2024   CL 100 03/16/2024   CO2 33 (H) 03/16/2024   Lab Results  Component Value Date   ALT 54 (H) 03/16/2024   AST 36 03/16/2024   ALKPHOS 37 (L) 03/16/2024   BILITOT 0.3 03/16/2024   Lab Results  Component Value Date   CHOL 149 02/13/2023   HDL 28.20 (L) 02/13/2023   LDLCALC 86 02/13/2023   TRIG 174.0 (H) 02/13/2023   CHOLHDL 5 02/13/2023    Lab Results  Component Value Date   HGBA1C 6.2 03/15/2024    Assessment & Plan   1. Recurrent syncope: 1 month history of recurrent syncope. CT of the head, MRI of the brain, EEG were unremarkable.  Orthostatics were negative.  Echocardiogram in 02/2024 showed EF 60 to 65% normal LV function, no RWMA, mild LVH, normal RV systolic function, aortic valve sclerosis with no evidence of aortic valve stenosis. 14-day ZIO monitor showed 2 brief runs of SVT, rare PACs and PVCs, no significant arrhythmia.  Since his most recent ED visit on 03/16/2024, he denies any recurrent syncope.  Has stopped drinking caffeine and increased his fluid intake. Echo/monitor reassuring.  Will check carotid ultrasound.   Reviewed ED precautions.  2. Coronary artery calcification noted on CT: Minimal coronary artery calcification noted on recent CT chest.  Stable with no anginal symptoms. We discussed possible coronary CT angiogram in setting of recurrent syncope, and for further risk stratification; he declines at this time.    3. SVT: Noted on recent cardiac monitor as above.  He denies any palpitations.  Continue to monitor for symptoms.  4. Prediabetes: A1c was 6.2 in 03/2024.  Monitored and managed per PCP.  5. History of snoring: Pending sleep study per PCP.   6. Disposition: Follow-up in in 3-4 months with Dr. Michele.    Damien JAYSON Braver, NP 03/31/2024, 7:33 PM       [1]  Allergies Allergen Reactions   Shrimp (Diagnostic) Anaphylaxis   Bee Venom    Shellfish Allergy 

## 2024-03-29 NOTE — Telephone Encounter (Signed)
 Final report posted.   03/12/24 - 9:42pm Transmission initially labeled as AFib but they changed it to SR.   [employer] called with this update.   Per chart review, PA has already notified patient

## 2024-03-29 NOTE — Patient Instructions (Signed)
 Medication Instructions:  Your physician recommends that you continue on your current medications as directed. Please refer to the Current Medication list given to you today.  *If you need a refill on your cardiac medications before your next appointment, please call your pharmacy*  Lab Work: None ordered  If you have labs (blood work) drawn today and your tests are completely normal, you will receive your results only by: MyChart Message (if you have MyChart) OR A paper copy in the mail If you have any lab test that is abnormal or we need to change your treatment, we will call you to review the results.  Testing/Procedures: Your physician has requested that you have a carotid duplex. This test is an ultrasound of the carotid arteries in your neck. It looks at blood flow through these arteries that supply the brain with blood. Allow one hour for this exam. There are no restrictions or special instructions.   Follow-Up: At [facility], you and your health needs are our priority.  As part of our continuing mission to provide you with exceptional heart care, our providers are all part of one team.  This team includes your primary Cardiologist (physician) and Advanced Practice Providers or APPs (Physician Assistants and Nurse Practitioners) who all work together to provide you with the care you need, when you need it.  Your next appointment:   3 month(s)  Provider:   Madonna Large, DO    We recommend signing up for the patient portal called MyChart.  Sign up information is provided on this After Visit Summary.  MyChart is used to connect with patients for Virtual Visits (Telemedicine).  Patients are able to view lab/test results, encounter notes, upcoming appointments, etc.  Non-urgent messages can be sent to your provider as well.   To learn more about what you can do with MyChart, go to [email or web address].   Other Instructions

## 2024-03-29 NOTE — Telephone Encounter (Signed)
 Irythm Reporting abnormal finding.

## 2024-03-31 ENCOUNTER — Encounter: Payer: Self-pay | Admitting: Nurse Practitioner

## 2024-03-31 ENCOUNTER — Ambulatory Visit (HOSPITAL_BASED_OUTPATIENT_CLINIC_OR_DEPARTMENT_OTHER)
Admission: RE | Admit: 2024-03-31 | Discharge: 2024-03-31 | Disposition: A | Discharge status: Home / Self Care | Source: Ambulatory Visit | Attending: Family Medicine | Admitting: Family Medicine

## 2024-03-31 DIAGNOSIS — E041 Nontoxic single thyroid nodule: Secondary | ICD-10-CM | POA: Diagnosis present

## 2024-03-31 NOTE — Addendum Note (Signed)
 Addended by: ALMARIE BIRMINGHAM B on: 03/31/2024 01:32 PM   Modules accepted: Orders

## 2024-04-01 ENCOUNTER — Encounter (INDEPENDENT_AMBULATORY_CARE_PROVIDER_SITE_OTHER): Payer: Self-pay

## 2024-04-02 ENCOUNTER — Emergency Department (HOSPITAL_COMMUNITY)

## 2024-04-02 ENCOUNTER — Emergency Department (HOSPITAL_COMMUNITY)
Admission: EM | Admit: 2024-04-02 | Discharge: 2024-04-02 | Disposition: A | Discharge status: Home / Self Care | Attending: Emergency Medicine | Admitting: Emergency Medicine

## 2024-04-02 ENCOUNTER — Encounter (HOSPITAL_COMMUNITY): Payer: Self-pay | Admitting: *Deleted

## 2024-04-02 ENCOUNTER — Other Ambulatory Visit: Payer: Self-pay

## 2024-04-02 DIAGNOSIS — I5020 Unspecified systolic (congestive) heart failure: Secondary | ICD-10-CM | POA: Diagnosis not present

## 2024-04-02 DIAGNOSIS — R569 Unspecified convulsions: Secondary | ICD-10-CM | POA: Insufficient documentation

## 2024-04-02 DIAGNOSIS — E860 Dehydration: Secondary | ICD-10-CM | POA: Insufficient documentation

## 2024-04-02 DIAGNOSIS — R55 Syncope and collapse: Secondary | ICD-10-CM | POA: Diagnosis present

## 2024-04-02 LAB — CBC WITH DIFFERENTIAL/PLATELET
Abs Immature Granulocytes: 0.03 K/uL (ref 0.00–0.07)
Basophils Absolute: 0 K/uL (ref 0.0–0.1)
Basophils Relative: 0 %
Eosinophils Absolute: 0.1 K/uL (ref 0.0–0.5)
Eosinophils Relative: 1 %
HCT: 41.1 % (ref 39.0–52.0)
Hemoglobin: 12.5 g/dL — ABNORMAL LOW (ref 13.0–17.0)
Immature Granulocytes: 0 %
Lymphocytes Relative: 21 %
Lymphs Abs: 1.8 K/uL (ref 0.7–4.0)
MCH: 26.9 pg (ref 26.0–34.0)
MCHC: 30.4 g/dL (ref 30.0–36.0)
MCV: 88.4 fL (ref 80.0–100.0)
Monocytes Absolute: 0.7 K/uL (ref 0.1–1.0)
Monocytes Relative: 8 %
Neutro Abs: 6 K/uL (ref 1.7–7.7)
Neutrophils Relative %: 70 %
Platelets: 166 K/uL (ref 150–400)
RBC: 4.65 MIL/uL (ref 4.22–5.81)
RDW: 13.2 % (ref 11.5–15.5)
WBC: 8.6 K/uL (ref 4.0–10.5)
nRBC: 0 % (ref 0.0–0.2)

## 2024-04-02 LAB — URINE DRUG SCREEN
Amphetamines: NEGATIVE
Barbiturates: NEGATIVE
Benzodiazepines: NEGATIVE
Cocaine: NEGATIVE
Fentanyl: NEGATIVE
Methadone Scn, Ur: NEGATIVE
Opiates: NEGATIVE
Tetrahydrocannabinol: NEGATIVE

## 2024-04-02 LAB — COMPREHENSIVE METABOLIC PANEL WITH GFR
ALT: 55 U/L — ABNORMAL HIGH (ref 0–44)
AST: 38 U/L (ref 15–41)
Albumin: 4.2 g/dL (ref 3.5–5.0)
Alkaline Phosphatase: 41 U/L (ref 38–126)
Anion gap: 11 (ref 5–15)
BUN: 12 mg/dL (ref 6–20)
CO2: 23 mmol/L (ref 22–32)
Calcium: 9.5 mg/dL (ref 8.9–10.3)
Chloride: 102 mmol/L (ref 98–111)
Creatinine, Ser: 0.93 mg/dL (ref 0.61–1.24)
GFR, Estimated: >60 mL/min
Glucose, Bld: 104 mg/dL — ABNORMAL HIGH (ref 70–99)
Potassium: 4.6 mmol/L (ref 3.5–5.1)
Sodium: 136 mmol/L (ref 135–145)
Total Bilirubin: 0.3 mg/dL (ref 0.0–1.2)
Total Protein: 7.5 g/dL (ref 6.5–8.1)

## 2024-04-02 LAB — URINALYSIS, ROUTINE W REFLEX MICROSCOPIC
Bilirubin Urine: NEGATIVE
Glucose, UA: NEGATIVE mg/dL
Hgb urine dipstick: NEGATIVE
Ketones, ur: NEGATIVE mg/dL
Leukocytes,Ua: NEGATIVE
Nitrite: NEGATIVE
Protein, ur: NEGATIVE mg/dL
Specific Gravity, Urine: >1.03 — ABNORMAL HIGH (ref 1.005–1.030)
pH: 5.5 (ref 5.0–8.0)

## 2024-04-02 LAB — TROPONIN T, HIGH SENSITIVITY: Troponin T High Sensitivity: <15 ng/L (ref 0–19)

## 2024-04-02 LAB — CBG MONITORING, ED: Glucose-Capillary: 101 mg/dL — ABNORMAL HIGH (ref 70–99)

## 2024-04-02 LAB — ETHANOL: Alcohol, Ethyl (B): <15 mg/dL

## 2024-04-02 MED ORDER — LEVETIRACETAM (KEPPRA) 500 MG/5 ML ADULT IV PUSH
1500.0000 mg | Freq: Once | INTRAVENOUS | Status: AC
  Administered 2024-04-02: 1500 mg via INTRAVENOUS
  Filled 2024-04-02: qty 15

## 2024-04-02 MED ORDER — SODIUM CHLORIDE 0.9 % IV BOLUS
1000.0000 mL | Freq: Once | INTRAVENOUS | Status: AC
  Administered 2024-04-02: 1000 mL via INTRAVENOUS

## 2024-04-02 MED ORDER — LEVETIRACETAM 500 MG PO TABS: 500.0000 mg | ORAL_TABLET | Freq: Two times a day (BID) | ORAL | 0 refills | Status: DC

## 2024-04-02 NOTE — ED Notes (Signed)
 Patient states he took 2 gummies to help him sleep this am

## 2024-04-02 NOTE — Discharge Instructions (Addendum)
 No driving for 6 months until cleared by Neurology.  Take keppra  500 mg twice daily   Return to ER if you have another seizure, passing out

## 2024-04-02 NOTE — ED Provider Notes (Signed)
 " [name] Provider Note   CSN: [id number] Arrival date & time: 04/02/24  1401     Patient presents with: Near Syncope   [name] is a 43 y.o. male.   Pt is a 43 yo male with pmhx significant for recurrent syncope, arthritis, gout, and Raynaud's disease.  Pt has had a full work up.  He has had MRI of the brain, EEG, ECHO with EF 60-65%.  He was wearing a zio patch recently had had 2 brief runs of SVT.  He did see cards on 12/15 and was doing well then.  He had a few more syncopal events today and was more somnolent, so EMS was called.  He does not remember the events.  He does not recall rapid HR.  He did tell the nurse he took 2 melatonin gummies today.  He denies any pain.       Prior to Admission medications  Medication Sig Start Date End Date Taking? Authorizing Provider  levETIRAcetam (KEPPRA) 500 MG tablet Take 1 tablet (500 mg total) by mouth 2 (two) times daily. 04/02/24  Yes Dean Clarity, MD  EPINEPHrine  0.3 mg/0.3 mL IJ SOAJ injection Inject 0.3 mg into the muscle as needed for anaphylaxis. 04/29/23   [name], [name], MD  Melatonin 5 MG CHEW Chew 2 Pieces of gum by mouth at bedtime.    [provider]  meloxicam  (MOBIC ) 15 MG tablet TAKE 1 TABLET(15 MG) BY MOUTH DAILY 10/13/23   [name], NP  Multiple Vitamin (MULTIVITAMIN) tablet Take 1 tablet by mouth daily.    [provider]  omeprazole (PRILOSEC OTC) 20 MG tablet Take 20 mg by mouth daily.    [provider]  ondansetron  (ZOFRAN -ODT) 4 MG disintegrating tablet Take 1 tablet (4 mg total) by mouth every 8 (eight) hours as needed for nausea or vomiting. 03/16/24   Keith, Kayla N, PA-C  polyethylene glycol powder (GLYCOLAX /MIRALAX ) 17 GM/SCOOP powder Take 17 g by mouth daily. Dissolve 1 capful (17g) in 4-8 ounces of liquid and take by mouth daily. 03/16/24   [name], PA-C    Allergies: Shrimp (diagnostic), Bee venom, and Shellfish  allergy     Review of Systems  Neurological:  Positive for syncope.  All other systems reviewed and are negative.   Updated Vital Signs BP 112/68   Pulse 72   Temp 98 F (36.7 C) (Oral)   Resp 11   Ht 6' 3 (1.905 m)   Wt 133.8 kg   SpO2 100%   BMI 36.87 kg/m   Physical Exam Vitals and nursing note reviewed.  Constitutional:      Appearance: Normal appearance.  HENT:     Head: Normocephalic and atraumatic.     Right Ear: External ear normal.     Left Ear: External ear normal.     Nose: Nose normal.     Mouth/Throat:     Mouth: Mucous membranes are dry.  Eyes:     Extraocular Movements: Extraocular movements intact.     Conjunctiva/sclera: Conjunctivae normal.     Pupils: Pupils are equal, round, and reactive to light.  Cardiovascular:     Rate and Rhythm: Normal rate and regular rhythm.     Pulses: Normal pulses.     Heart sounds: Normal heart sounds.  Pulmonary:     Effort: Pulmonary effort is normal.     Breath sounds: Normal breath sounds.  Musculoskeletal:  General: Normal range of motion.     Cervical back: Normal range of motion and neck supple.  Skin:    General: Skin is warm.     Capillary Refill: Capillary refill takes less than 2 seconds.  Neurological:     General: No focal deficit present.     Mental Status: He is alert and oriented to person, place, and time.  Psychiatric:        Mood and Affect: Mood normal.        Behavior: Behavior normal.     (all labs ordered are listed, but only abnormal results are displayed) Labs Reviewed  COMPREHENSIVE METABOLIC PANEL WITH GFR - Abnormal; Notable for the following components:      Result Value   Glucose, Bld 104 (*)    ALT 55 (*)    All other components within normal limits  CBC WITH DIFFERENTIAL/PLATELET - Abnormal; Notable for the following components:   Hemoglobin 12.5 (*)    All other components within normal limits  CBG MONITORING, ED - Abnormal; Notable for the following components:    Glucose-Capillary 101 (*)    All other components within normal limits  ETHANOL  URINALYSIS, ROUTINE W REFLEX MICROSCOPIC  URINE DRUG SCREEN  TROPONIN T, HIGH SENSITIVITY    EKG: EKG Interpretation Date/Time:  Friday April 02 2024 14:07:59 EST Ventricular Rate:  70 PR Interval:  162 QRS Duration:  115 QT Interval:  382 QTC Calculation: 413 R Axis:   23  Text Interpretation: Sinus rhythm Incomplete right bundle branch block ST elev, probable normal early repol pattern No significant change since last tracing Confirmed by Dean Clarity [id number]) on 04/02/2024 2:31:43 PM  Radiology: [facility] Chest Port 1 View Result Date: 04/02/2024 EXAM: 1 VIEW(S) XRAY OF THE CHEST 04/02/2024 02:33:00 PM COMPARISON: 03/16/2024 CLINICAL HISTORY: ams FINDINGS: LUNGS AND PLEURA: Low lung volumes. Bibasilar atelectasis. No pleural effusion. No pneumothorax. HEART AND MEDIASTINUM: Cardiomegaly. BONES AND SOFT TISSUES: No acute osseous abnormality. IMPRESSION: 1. Low lung volumes with bibasilar atelectasis. 2. Cardiomegaly. Electronically signed by: Lonni Necessary MD 04/02/2024 03:06 PM EST RP Workstation: [id number]   CT HEAD WO CONTRAST Result Date: 04/02/2024 EXAM: CT HEAD WITHOUT CONTRAST 04/02/2024 02:46:21 PM TECHNIQUE: CT of the head was performed without the administration of intravenous contrast. Automated exposure control, iterative reconstruction, and/or weight based adjustment of the mA/kV was utilized to reduce the radiation dose to as low as reasonably achievable. COMPARISON: MRI head without and with contrast 03/04/2024 and CT head without contrast 10/22/2021 report. CLINICAL HISTORY: Mental status change, unknown cause. FINDINGS: BRAIN AND VENTRICLES: No acute hemorrhage. No evidence of acute infarct. No hydrocephalus. No extra-axial collection. No mass effect or midline shift. ORBITS: No acute abnormality. SINUSES: No acute abnormality. SOFT TISSUES AND SKULL: No acute soft tissue abnormality.  No skull fracture. IMPRESSION: 1. No acute intracranial abnormality. Electronically signed by: Lonni Necessary MD 04/02/2024 03:05 PM EST RP Workstation: [id number]     Procedures   Medications Ordered in the ED  sodium chloride  0.9 % bolus 1,000 mL (has no administration in time range)  levETIRAcetam (KEPPRA) undiluted injection 1,500 mg (has no administration in time range)                                    Medical Decision Making Amount and/or Complexity of Data Reviewed Labs: ordered. Radiology: ordered.  Risk Prescription drug management.   This patient presents to the  ED for concern of syncope, this involves an extensive number of treatment options, and is a complaint that carries with it a high risk of complications and morbidity.  The differential diagnosis includes svt, electrolyte abn, melatonin gummies   Co morbidities that complicate the patient evaluation  recurrent syncope, arthritis, gout, and Raynaud's disease   Additional history obtained:  Additional history obtained from epic chart review External records from outside source obtained and reviewed including EMS report   Lab Tests:  I Ordered, and personally interpreted labs.  The pertinent results include:  cbc nl other than hgb sl low at 12.5, cmp nl, etoh neg, trop neg   Imaging Studies ordered:  I ordered imaging studies including cxr and ct head  I independently visualized and interpreted imaging which showed CXR: Low lung volumes with bibasilar atelectasis.  2. Cardiomegaly.  CT head: . No acute intracranial abnormality.  I agree with the radiologist interpretation   Cardiac Monitoring:  The patient was maintained on a cardiac monitor.  I personally viewed and interpreted the cardiac monitored which showed an underlying rhythm of: nsr   Medicines ordered and prescription drug management:  I ordered medication including ivfs  for sx  Reevaluation of the patient after these  medicines showed that the patient improved I have reviewed the patients home medicines and have made adjustments as needed   Test Considered:  ct  Consultations Obtained:  I requested consultation with the cardiologist,  and discussed lab and imaging findings as well as pertinent plan - he does not think sx are from SVT Pt d/w Dr. Michaela (neurology) who recommends starting keppra and outpatient f/u   Problem List / ED Course:  Syncope:  likely due to seizures.  Pt's wife caught 1 of his episodes on video.  Episode did look like a seizure.  We will start him on keppra.  He knows not to drive.  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     Reevaluation:  After the interventions noted above, I reevaluated the patient and found that they have :improved   Social Determinants of Health:  Lives at home   Dispostion:  After consideration of the diagnostic results and the patients response to treatment, I feel that the patent would benefit from discharge with outpatient f/u.       Final diagnoses:  Dehydration  Syncope, unspecified syncope type  Seizure [facility])    ED Discharge Orders          Ordered    levETIRAcetam (KEPPRA) 500 MG tablet  2 times daily        04/02/24 1559    Ambulatory referral to Neurology       Comments: An appointment is requested in approximately: 1 week   04/02/24 1559               Dean Clarity, MD 04/02/24 1601  "

## 2024-04-02 NOTE — ED Notes (Signed)
 [facility] called

## 2024-04-02 NOTE — ED Provider Notes (Signed)
" °  Physical Exam  BP 113/65   Pulse 74   Temp 98 F (36.7 C) (Oral)   Resp 15   Ht 6' 3 (1.905 m)   Wt 133.8 kg   SpO2 100%   BMI 36.87 kg/m   Physical Exam  Procedures  Procedures  ED Course / MDM    Medical Decision Making Care assumed at 3 pm.  Patient is here with seizure-like activity.  Neurology was consulted and recommend Keppra.  6:02 PM Patient is back to baseline, will dc home with keppra.   Amount and/or Complexity of Data Reviewed Labs: ordered. Decision-making details documented in ED Course. Radiology: ordered and independent interpretation performed. Decision-making details documented in ED Course.  Risk Prescription drug management.          Patt Alm Macho, MD 04/02/24 [phone]  "

## 2024-04-03 DIAGNOSIS — I471 Supraventricular tachycardia, unspecified: Secondary | ICD-10-CM

## 2024-04-03 DIAGNOSIS — R55 Syncope and collapse: Secondary | ICD-10-CM

## 2024-04-05 ENCOUNTER — Telehealth: Payer: Self-pay | Admitting: Cardiology

## 2024-04-05 NOTE — Telephone Encounter (Signed)
 Called and spoke to Phylicia (wife) with pt in the background. He has Carotid dopplers scheduled for 04/06/24. These problems have been addressed today (see Telephone Encounter) by Triage nurse.

## 2024-04-05 NOTE — Telephone Encounter (Signed)
 Called and spoke to pt. He states he has already been to ER on 04/02/24. Per chart review, Cardiology and Neurology were consulted.  He was d/c on Keppra  with Neurology referral/outpt f/u.

## 2024-04-05 NOTE — Telephone Encounter (Signed)
 Spoke with patient's sister Clayborne ([place]).   Patient has syncopal episodes lasting 30 minutes - 1 hour.   Patient was told symptoms (video recordings) look like seizures.   Sister said Keppra  does not help - no refills. Advised if to continue, will need to f/u with PCP or neurology.  He is staying hydrated per sister report - however she said he was told by EMS he was dehydrated. 12/20 EMS was called - told patient/family EKG abnormal. Report not available for review. Asked that it be uploaded to MyChart  Discussed recent reassuring cardiac testing.   Sister asked for sooner carotid doppler order. Secure chat message sent to F. Clark.  Advised to check with PCP for other non-cardiac causes.

## 2024-04-05 NOTE — Telephone Encounter (Signed)
 Pt c/o Syncope: STAT if syncope occurred within 24 hours and pt complains of lightheadedness  Did you pass out today? No   When is the last time you passed out? 11pm    Has this occurred multiple times? Yes    Did you have any symptoms prior to passing out? Rocking back and forward    5. Did you fall? If so, are you on a blood thinner? yes    Pt is experiencing lightheadedness and tired

## 2024-04-06 ENCOUNTER — Telehealth: Payer: Self-pay | Admitting: Family Medicine

## 2024-04-06 ENCOUNTER — Ambulatory Visit (HOSPITAL_COMMUNITY)
Admission: RE | Admit: 2024-04-06 | Discharge: 2024-04-06 | Disposition: A | Discharge status: Home / Self Care | Source: Ambulatory Visit | Attending: Nurse Practitioner | Admitting: Nurse Practitioner

## 2024-04-06 DIAGNOSIS — R55 Syncope and collapse: Secondary | ICD-10-CM

## 2024-04-06 DIAGNOSIS — R413 Other amnesia: Secondary | ICD-10-CM

## 2024-04-06 NOTE — Telephone Encounter (Signed)
 Copied from CRM #[id number]. Topic: Referral - Question >> Apr 06, 2024  1:36 PM Ahlexyia S wrote: Reason for CRM: Pt sister Clayborne called in stating that pt is not being himself lately. Pt was referred to a neurologist but they are currently booked out until 02/26 which the pt is scheduled for. Clayborne was told by neurologist offices that she would need to contact provider so pt can be referred to someone else ASAP due to pt current symptoms. Clayborne is requesting a callback.

## 2024-04-06 NOTE — Telephone Encounter (Signed)
 Called and spoke with patient's sister Clayborne to see what kind of symptoms he is having.   She states: Memory issues. Frustration doing simple tasks. Spells of not speaking. Can not stand for more than an hour without being tired and having to rest.Has been going on about a month and getting worse. Seeing cardiology today.   Please advise.

## 2024-04-07 ENCOUNTER — Ambulatory Visit: Payer: Self-pay

## 2024-04-07 NOTE — Telephone Encounter (Signed)
 LVMTCB at both numbers. EP referral entered. Cassie Hall to attempt and contact pt on 04/09/24.

## 2024-04-07 NOTE — Telephone Encounter (Signed)
 FYI Only or Action Required?: Action required by provider: update on patient condition.  Patient was last seen in primary care on 03/15/2024 by [name], NP.  Called Nurse Triage reporting Altered Mental Status.  Symptoms began several months ago.  Interventions attempted: Prescription medications: Keppra .  Symptoms are: gradually worsening.  Triage Disposition: Call EMS 911 Now  Patient/caregiver understands and will follow disposition?: No, wishes to speak with PCP               Copied from CRM [id number]. Topic: Clinical - Red Word Triage >> Apr 07, 2024 11:31 AM Rosina BIRCH wrote: Reason for RMF:ejupzwu wife called stating the patient is having several episodes right after he went to see the cardiologist. Patient is having three to four episodes a day and refused to let the EMS take him. Patient need a new work note and want to see if the referral to neurology can done sooner than 2/5. The cardiologist is sending him to a place to make sure his heart is not misfiring and that is 1/15. Patient wife think the patient need to be admitted to the hospital until they get things figured out. Reason for Disposition  Seeing, hearing, or feeling things that are not there (i.e., visual, auditory, or tactile hallucinations)  Answer Assessment - Initial Assessment Questions 1. LEVEL OF CONSCIOUSNESS: How are they (the patient) acting right now? (e.g., alert-oriented, confused, lethargic, stuporous, comatose)     She is not with the patient currently but she states he has been having episodes that consist of confusion or being incoherent. She states he will be alert and oriented at moments or some days and then will have these episodes a few days apart. She describes it as the other day he turned on the oven and placed an empty pan in the oven. He also will go nonverbal or start rocking back and forth. She states last night he sat on the toilet for 2-3 hours and she had to convince him  to get off. He complains of fatigue and tiredness and when she asked if he wants to lie down he snapped and yelled at her.  2. ONSET: When did the confusion start?  (e.g., minutes, hours, days)     She states it  has been documented since November but they think it 3. PATTERN: Does this come and go, or has it been constant since it started?  Is it present now?     Comes and goes. She is not currently with the patient but is on her way to see him now.  4. ALCOHOL or DRUGS: Have they been drinking alcohol or taking any drugs?      N/A.  5. NARCOTIC MEDICINES: Have they been receiving any narcotic medications? (e.g., morphine, Vicodin)     N/A.  6. CAUSE: What do you think is causing the confusion?      She thinks this is related to his recent diagnosis of seizures and was placed on Keppra . She states since starting the medication he is no longer having the syncope and jerking seizures or tremors but states he is having the confusion now.  7. OTHER SYMPTOMS: Are there any other symptoms? (e.g., difficulty breathing, fever, headache, weakness)     No difficulty breathing, recent head injuries, unilateral numbness or weakness. She states he has slurred speech that comes and goes just before an episode starts x months.   She states Sunday, EMS was called and the patient refused to go to  the ED and rescue told her it is against the law for them to force him to go to the ED>  Protocols used: Confusion - Delirium-A-AH

## 2024-04-07 NOTE — Telephone Encounter (Signed)
 Spoke with his sister today. Please review note.

## 2024-04-07 NOTE — Telephone Encounter (Signed)
Pt seen 12/23

## 2024-04-07 NOTE — Telephone Encounter (Signed)
 Yes, if symptoms are severe he needs to go to the ED. Okay to schedule a follow-up for next week, but based on reported symptoms and family concerns, he probably needs to go ahead to the ED.

## 2024-04-07 NOTE — Telephone Encounter (Signed)
 Spoke with patient's wife and made aware of recommendations. They are asking for a note to keep him out of work. Letter written.   RICK [place] - I wrote him out until evaluated by Neurology for now, can change letter if needed.

## 2024-04-07 NOTE — Telephone Encounter (Signed)
 I'm sending in an urgent referral - can try for [facility], etc to see if someone can see him sooner. If having alarming symptoms, he needs to go to the emergency department.

## 2024-04-07 NOTE — Telephone Encounter (Signed)
 Received incoming call from wife, Phylicia. Reviewed in detail Dr. Tyree rec's. (To include Preliminary Carotid doppler results). Will see Dr. Inocencio on 04/29/24. Also, neurology on 05/19/24.   She states that pt continues to have these episodes of passing out. Since starting the Keppra  medication he has had fewer jerking movements but now is having other zombie like episodes. She has installed cameras in the rooms to help with monitoring the activity. He does not drive. There is usually someone with him at home. She has been trying to contact PCP for a more urgent Neurology appointment/eval. She verbalized understanding of when to call 911 but the problem is that the pt will refuse to go to ER.   She has also tried to convince pt to be admitted to another health system for further work up but pt refuses.

## 2024-04-07 NOTE — Telephone Encounter (Signed)
 Good morning,  I have not seen him in office - last seen by Damien in [facility].  I reviewed the results of the strips above, echo, monitor, and carotid duplex. No unifying diagnosis.  Refer him to EP for recurrent syncope. Have his primary arrange neurology consult as well. Once seen by EP please have him follow-up with me.  Please go over the driving instructions status post syncope.   Dr. Dhruv Christina

## 2024-04-07 NOTE — Telephone Encounter (Signed)
 Spoke with patient's sister and let her know message from [place].

## 2024-04-13 ENCOUNTER — Ambulatory Visit (HOSPITAL_COMMUNITY)

## 2024-04-19 ENCOUNTER — Encounter: Payer: Self-pay | Admitting: Family Medicine

## 2024-04-21 ENCOUNTER — Ambulatory Visit: Payer: Self-pay | Admitting: Nurse Practitioner

## 2024-04-21 NOTE — Telephone Encounter (Signed)
 Copied from CRM #[id number]. Topic: General - Other >> Apr 21, 2024 10:49 AM Treva T wrote: Reason for CRM: Pt calling to inquire if form that he resent to office was received, for clearer visibility. States mychart message was sent, but has not received a response.  Pt would like to confirm received, and wants to know if forms can be completed as soon as possible and submitted.  Dates of 03/30/24-05/20/24, in order to return to work.  Pt would like a follow up call to advise.  Can be reached at [phone].  Pt is aware of same day follow up call back.

## 2024-04-22 NOTE — Telephone Encounter (Signed)
 Pt dropped off these forms to be filled out by pcp. Forms placed in pcps box

## 2024-04-23 ENCOUNTER — Ambulatory Visit: Admitting: Diagnostic Neuroimaging

## 2024-04-23 ENCOUNTER — Encounter: Payer: Self-pay | Admitting: Diagnostic Neuroimaging

## 2024-04-23 VITALS — BP 142/93 | HR 92 | Resp 15 | Ht 75.0 in | Wt 287.6 lb

## 2024-04-23 DIAGNOSIS — R55 Syncope and collapse: Secondary | ICD-10-CM | POA: Diagnosis not present
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
## 2024-04-23 NOTE — Patient Instructions (Signed)
" °  RECURRENT SPELLS OF LOSS OF CONSCIOUSNESS (since Nov / Dec 2025; last spell ~04/03/24; possible low blood sugar attacks in setting of calorie restriction for weight loss goals)  - follow up with cardiology, EP specialist and PCP  - do not suspect seizure at this time; ok to stop levetiracetam    - According to [place] law, you can not drive unless you are syncope free for at least 6 months and under physician's care. Return to work / driving deferred to cardiology / PCP  - Please maintain precautions. Do not participate in activities where a loss of awareness could harm you or someone else. No swimming alone, no tub bathing, no hot tubs, no driving, no operating motorized vehicles (cars, ATVs, motocycles, etc), lawnmowers, power tools or firearms. No standing at heights, such as rooftops, ladders or stairs. Avoid hot objects such as stoves, heaters, open fires. Wear a helmet when riding a bicycle, scooter, skateboard, etc. and avoid areas of traffic. Set your water heater to 120 degrees or less. "

## 2024-04-29 ENCOUNTER — Encounter: Payer: Self-pay | Admitting: Cardiology

## 2024-04-29 ENCOUNTER — Ambulatory Visit: Attending: Cardiology | Admitting: Cardiology

## 2024-04-29 VITALS — BP 148/96 | HR 86 | Ht 75.0 in | Wt 283.0 lb

## 2024-04-29 DIAGNOSIS — R55 Syncope and collapse: Secondary | ICD-10-CM | POA: Diagnosis not present

## 2024-04-29 DIAGNOSIS — I471 Supraventricular tachycardia, unspecified: Secondary | ICD-10-CM
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
## 2024-04-30 ENCOUNTER — Ambulatory Visit (HOSPITAL_BASED_OUTPATIENT_CLINIC_OR_DEPARTMENT_OTHER)
Admission: RE | Admit: 2024-04-30 | Discharge: 2024-04-30 | Disposition: A | Discharge status: Home / Self Care | Source: Ambulatory Visit | Attending: Family Medicine | Admitting: Family Medicine

## 2024-04-30 DIAGNOSIS — R932 Abnormal findings on diagnostic imaging of liver and biliary tract: Secondary | ICD-10-CM | POA: Diagnosis present

## 2024-04-30 DIAGNOSIS — R748 Abnormal levels of other serum enzymes: Secondary | ICD-10-CM | POA: Insufficient documentation

## 2024-04-30 MED ORDER — GADOBUTROL 1 MMOL/ML IV SOLN
10.0000 mL | Freq: Once | INTRAVENOUS | Status: AC | PRN
  Administered 2024-04-30: 10 mL via INTRAVENOUS
  Filled 2024-04-30: qty 10

## 2024-05-06 ENCOUNTER — Encounter: Payer: Self-pay | Admitting: Primary Care

## 2024-05-06 ENCOUNTER — Ambulatory Visit: Admitting: Primary Care

## 2024-05-06 VITALS — BP 124/70 | HR 90 | Temp 98.2°F | Ht 75.0 in | Wt 281.0 lb

## 2024-05-06 DIAGNOSIS — R0683 Snoring: Secondary | ICD-10-CM

## 2024-05-06 DIAGNOSIS — Z87891 Personal history of nicotine dependence: Secondary | ICD-10-CM | POA: Diagnosis not present

## 2024-05-06 NOTE — Progress Notes (Signed)
 "  @Patient  ID: [name], male    DOB: February 03, 1981, 44 y.o.   MRN: [id number]  Chief Complaint  Patient presents with   Consult    Trouble falling asleep and staying asleep. Daytime fatigue     Referring provider: [name], NP  HPI: 1 year former smoker. PMH significant for pulmonary nodule, obesity, connective tissue disease.   05/06/2024 Discussed the use of AI scribe software for clinical note transcription with the patient, who gave verbal consent to proceed.  History of Present Illness [name] is a 44 year old male who presents with sleep disturbances and possible sleep apnea. He was referred by his primary care provider for evaluation of sleep disturbances and possible sleep apnea.  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 This irregular schedule has made it difficult for him to establish a consistent sleep pattern. He takes up to two hours to fall asleep and wakes up every other hour, frequently waking up around 3 AM without a clear reason. Despite these issues, he does not experience consistent daytime sleepiness and does not fall asleep at work, with an Epworth Sleepiness Scale score of 6.  Occasional snoring has been noted by his partner. He has tried melatonin, which was given to him by his partner, but it has not consistently helped with his sleep issues. He has not used other sleep aids.  His past medical history includes prediabetes and a recent weight loss from 317 pounds. There is no known family history of sleep apnea. 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  No concern for narcolepsy or cataplexy.   Allergies[1]  Immunization History  Administered Date(s) Administered   DTaP 12/06/1982, 03/04/1983, 05/20/1983, 05/21/1984, 01/03/1988   Hepatitis B, PED/ADOLESCENT 01/16/1994, 02/20/1994, 07/31/1994   IPV 12/06/1982,  03/04/1983, 05/20/1983, 05/21/1984   MMR 01/03/1984    Past Medical History:  Diagnosis Date   Arthritis    Gout    NGU (nongonococcal urethritis)    Raynaud's disease    Skin infection     Tobacco History: Tobacco Use History[2] Counseling given: Not Answered   Outpatient Medications Prior to Visit  Medication Sig Dispense Refill   EPINEPHrine  0.3 mg/0.3 mL IJ SOAJ injection Inject 0.3 mg into the muscle as needed for anaphylaxis. 1 each 1   Melatonin 5 MG CHEW Chew 2 Pieces of gum by mouth at bedtime. (Patient not taking: Reported on 05/06/2024)     meloxicam  (MOBIC ) 15 MG tablet TAKE 1 TABLET(15 MG) BY MOUTH DAILY (Patient not taking: Reported on 05/06/2024) 30 tablet 0   Multiple Vitamin (MULTIVITAMIN) tablet Take 1 tablet by mouth daily. (Patient not taking: Reported on 05/06/2024)     omeprazole (PRILOSEC OTC) 20 MG tablet Take 20 mg by mouth daily. (Patient not taking: Reported on 05/06/2024)     ondansetron  (ZOFRAN -ODT) 4 MG disintegrating tablet Take 1 tablet (4 mg total) by mouth every 8 (eight) hours as needed for nausea or vomiting. (Patient not taking: Reported on 05/06/2024) 20 tablet 0   polyethylene glycol powder (GLYCOLAX /MIRALAX ) 17 GM/SCOOP powder Take 17 g by mouth daily. Dissolve 1 capful (17g) in 4-8 ounces of liquid and take by mouth daily. (Patient not taking: Reported on 05/06/2024) 238 g 0   No facility-administered medications prior to visit.   Review of Systems  Review of Systems  Constitutional: Negative.  Respiratory: Negative.     Physical Exam  BP 124/70   Pulse 90   Temp 98.2 F (36.8 C)   Ht 6' 3 (1.905 m) Comment: pt stated  Wt 281 lb (127.5 kg)   SpO2 96% Comment: ra  BMI 35.12 kg/m  Physical Exam Constitutional:      General: He is not in acute distress.    Appearance: Normal appearance. He is well-developed. He is not ill-appearing.  HENT:     Head: Normocephalic and atraumatic.     Mouth/Throat:     Mouth: Mucous membranes are  moist.     Pharynx: Oropharynx is clear.  Cardiovascular:     Rate and Rhythm: Normal rate and regular rhythm.     Heart sounds: Normal heart sounds.  Pulmonary:     Effort: Pulmonary effort is normal. No respiratory distress.     Breath sounds: Normal breath sounds. No wheezing or rhonchi.  Musculoskeletal:        General: Normal range of motion.     Cervical back: Normal range of motion and neck supple.  Skin:    General: Skin is warm and dry.     Findings: No erythema or rash.  Neurological:     General: No focal deficit present.     Mental Status: He is alert and oriented to person, place, and time. Mental status is at baseline.  Psychiatric:        Mood and Affect: Mood normal.        Behavior: Behavior normal.        Thought Content: Thought content normal.        Judgment: Judgment normal.      Lab Results:  CBC    Component Value Date/Time   WBC 8.6 04/02/2024 1423   RBC 4.65 04/02/2024 1423   HGB 12.5 (L) 04/02/2024 1423   HGB 14.6 12/17/2017 1100   HCT 41.1 04/02/2024 1423   HCT 46.0 12/17/2017 1100   PLT 166 04/02/2024 1423   PLT 206 12/17/2017 1100   MCV 88.4 04/02/2024 1423   MCV 86 12/17/2017 1100   MCH 26.9 04/02/2024 1423   MCHC 30.4 04/02/2024 1423   RDW 13.2 04/02/2024 1423   RDW 14.5 12/17/2017 1100   LYMPHSABS 1.8 04/02/2024 1423   LYMPHSABS 2.6 12/17/2017 1100   MONOABS 0.7 04/02/2024 1423   EOSABS 0.1 04/02/2024 1423   EOSABS 0.4 12/17/2017 1100   BASOSABS 0.0 04/02/2024 1423   BASOSABS 0.0 12/17/2017 1100    BMET    Component Value Date/Time   NA 136 04/02/2024 1423   K 4.6 04/02/2024 1423   CL 102 04/02/2024 1423   CO2 23 04/02/2024 1423   GLUCOSE 104 (H) 04/02/2024 1423   BUN 12 04/02/2024 1423   CREATININE 0.93 04/02/2024 1423   CALCIUM 9.5 04/02/2024 1423   GFRNONAA >60 04/02/2024 1423   GFRAA >60 07/24/2017 2106    BNP No results found for: BNP  ProBNP No results found for: PROBNP  Imaging: MR Abdomen W Wo  Contrast Result Date: 05/01/2024 CLINICAL DATA:  Hypoechogenicity along the porta hepatis seen on prior ultrasound, further evaluation. EXAM: MRI ABDOMEN WITHOUT AND WITH CONTRAST TECHNIQUE: Multiplanar multisequence MR imaging of the abdomen was performed both before and after the administration of intravenous contrast. CONTRAST:  10mL GADAVIST  GADOBUTROL  1 MMOL/ML IV SOLN COMPARISON:  CT March 16, 2024 and ultrasound March 15, 2024. FINDINGS: Lower chest: Heterogeneous signal in the lung bases is favored atelectasis. Hepatobiliary: Loss of  signal on out of phase imaging throughout the hepatic parenchyma with a few areas of non signal loss for instance along the porta hepatis, gallbladder fossa and falciform ligament. Along the falciform ligament there is a 10 mm focus of signal loss on out of phase imaging on image 34/5. Hypoenhancing 19 mm observation in segment IV along the falciform ligament seen on all delayed postcontrast pulse sequences favored to reflect differential perfusion. No arterially enhancing hepatic lesion. Gallbladder is unremarkable. No biliary ductal dilation. Pancreas: No pancreatic ductal dilation or evidence of acute inflammation. Spleen:  No splenomegaly. Adrenals/Urinary Tract: No suspicious adrenal nodule/mass. No hydronephrosis. Kidneys demonstrate symmetric enhancement. Stomach/Bowel: Colonic stool burden compatible with constipation. Loss of haustral folds in the visualized portions of the colon. No evidence of bowel obstruction. Vascular/Lymphatic: Normal caliber abdominal aorta. Smooth IVC contours. The portal, splenic and superior mesenteric veins are patent. No pathologically enlarged abdominal or pelvic lymph nodes. Other:  No significant abdominopelvic free fluid. Musculoskeletal: No suspicious osseous lesion. IMPRESSION: 1. Hepatic steatosis with a focus of more intense fatty deposition along the falciform ligament and areas of focal fatty sparing along the porta hepatis,  gallbladder fossa and falciform ligament. Likely corresponding with the hypoechogenic areas seen on prior ultrasound. 2. Hypoenhancing 19 mm observation in segment IV along the falciform ligament seen on all delayed postcontrast pulse sequences favored to reflect differential perfusion. No arterially enhancing hepatic lesion. 3. Colonic stool burden compatible with constipation. Loss of haustral folds in the visualized portions of the colon, which can be seen in the setting of chronic inflammatory bowel disease. Electronically Signed   By: Reyes Holder M.D.   On: 05/01/2024 17:43   VAS US  CAROTID Result Date: 04/10/2024 Carotid Arterial Duplex Study Patient Name:  ERI PLATTEN  Date of Exam:   04/06/2024 Medical Rec #: [id number]      Accession #:    [id number] Date of Birth: December 05, 1980      Patient Gender: M Patient Age:   59 years Exam Location:  [address] Procedure:      VAS US  CAROTID Referring Phys: EMILY MONGE --------------------------------------------------------------------------------  Indications:       Recurrent syncope. Risk Factors:      Past history of smoking. Comparison Study:  N/A Performing Technologist: Dena Pane  Examination Guidelines: A complete evaluation includes B-mode imaging, spectral Doppler, color Doppler, and power Doppler as needed of all accessible portions of each vessel. Bilateral testing is considered an integral part of a complete examination. Limited examinations for reoccurring indications may be performed as noted.  Right Carotid Findings: +----------+--------+--------+--------+------------------+------------------+           PSV cm/sEDV cm/sStenosisPlaque DescriptionComments           +----------+--------+--------+--------+------------------+------------------+ CCA Prox  129     15                                                   +----------+--------+--------+--------+------------------+------------------+ CCA Distal97      20                                 intimal thickening +----------+--------+--------+--------+------------------+------------------+ ICA Prox  139     24                                                   +----------+--------+--------+--------+------------------+------------------+  ICA Mid   59      20                                                   +----------+--------+--------+--------+------------------+------------------+ ICA Distal67      23                                                   +----------+--------+--------+--------+------------------+------------------+ ECA       97      15                                                   +----------+--------+--------+--------+------------------+------------------+ +----------+--------+-------+---------+-------------------+           PSV cm/sEDV cmsDescribe Arm Pressure (mmHG) +----------+--------+-------+---------+-------------------+ Subclavian207     30     Turbulent130                 +----------+--------+-------+---------+-------------------+ +---------+--------+--+--------+--+---------+ VertebralPSV cm/s67EDV cm/s11Antegrade +---------+--------+--+--------+--+---------+  Left Carotid Findings: +----------+--------+--------+--------+------------------+------------------+           PSV cm/sEDV cm/sStenosisPlaque DescriptionComments           +----------+--------+--------+--------+------------------+------------------+ CCA Prox  106     22                                                   +----------+--------+--------+--------+------------------+------------------+ CCA Distal114     21                                intimal thickening +----------+--------+--------+--------+------------------+------------------+ ICA Prox  117     19                                                   +----------+--------+--------+--------+------------------+------------------+ ICA Mid   86      14                                                    +----------+--------+--------+--------+------------------+------------------+ ICA Distal69      26                                                   +----------+--------+--------+--------+------------------+------------------+ ECA       95      19                                                   +----------+--------+--------+--------+------------------+------------------+ +----------+--------+--------+----------------+-------------------+  PSV cm/sEDV cm/sDescribe        Arm Pressure (mmHG) +----------+--------+--------+----------------+-------------------+ Subclavian175     0       Multiphasic, TWO864                 +----------+--------+--------+----------------+-------------------+ +---------+--------+--+--------+-+---------+ VertebralPSV cm/s67EDV cm/s9Antegrade +---------+--------+--+--------+-+---------+   Summary: Right Carotid: The extracranial vessels were near-normal with only minimal wall                thickening or plaque. The PSV of the ICA falls under 40-59%                stenosis, based on EDV and appearance the ICA appears near                normal. Left Carotid: The extracranial vessels were near-normal with only minimal wall               thickening or plaque. The PSV of the ICA falls under 40-59%               stenosis, based on EDV and appearance the ICA appears near normal. Vertebrals:  Bilateral vertebral arteries demonstrate antegrade flow. Subclavians: Right subclavian artery flow was disturbed. Normal flow              hemodynamics were seen in the left subclavian artery. *See table(s) above for measurements and observations.  Electronically signed by Maude Emmer MD on 04/10/2024 at 9:24:15 PM.    Final      Assessment & Plan:   1. Loud snoring (Primary) - Home sleep test; Future  Assessment and Plan Assessment & Plan Sleep disturbance with evaluation for obstructive sleep apnea Chronic sleep  disturbance characterized by difficulty falling asleep, frequent awakenings, and occasional snoring. Epworth Sleepiness Scale score of 6 indicates slight daytime sleepiness. Differential diagnosis includes obstructive sleep apnea, given the symptoms of sleep disruption and snoring. Risk factors include obesity and irregular sleep schedule due to on-call work. Discussed potential for sleep apnea to cause fragmented sleep and increased risk for cardiac issues, arrhythmias, stroke, pulmonary hypertension, and diabetes. Explained that untreated sleep apnea can lead to light, non-restorative sleep and increased health risks. - Ordered home sleep study  - Recommended trial of melatonin 3-5 mg nightly for two weeks to regulate sleep-wake cycles. - If melatonin is ineffective, will consider trial of hydroxyzine (Atarax) for anxiety and insomnia. - Discussed potential treatment options if sleep apnea is confirmed, including CPAP or mouth guard. - Encourage side sleeping positions, encourage weight loss, advised against driving if tired.  Almarie LELON Ferrari, NP 05/06/2024     [1]  Allergies Allergen Reactions   Shrimp (Diagnostic) Anaphylaxis   Bee Venom    Shellfish Allergy    [2]  Social History Tobacco Use  Smoking Status Former   Types: Cigarettes   Start date: 2004   Passive exposure: Never  Smokeless Tobacco Never   "

## 2024-05-06 NOTE — Telephone Encounter (Signed)
 Pended GI referral, please review and make sure I filled it out correctly

## 2024-05-06 NOTE — Patient Instructions (Signed)
" °  VISIT SUMMARY: During your visit, we discussed your ongoing sleep disturbances and the possibility of sleep apnea. You have been experiencing difficulty falling asleep, frequent awakenings, and occasional snoring. We reviewed your symptoms and risk factors, including your irregular sleep schedule and recent weight loss.  YOUR PLAN: -SLEEP DISTURBANCE WITH EVALUATION FOR OBSTRUCTIVE SLEEP APNEA: Sleep disturbance involves difficulty falling asleep, frequent awakenings, and occasional snoring. We are considering obstructive sleep apnea, which can cause fragmented sleep and increase health risks such as cardiac issues, stroke, and diabetes. We have ordered a home sleep study to evaluate for sleep apnea. In the meantime, you should try taking melatonin 3-5 mg nightly for two weeks to help regulate your sleep-wake cycles. If melatonin does not help, we may try hydroxyzine (Atarax) for anxiety and insomnia. If sleep apnea is confirmed, potential treatments include CPAP or a mouth guard.  INSTRUCTIONS: Please complete the home sleep study as ordered. Continue taking melatonin 3-5 mg nightly for two weeks. If you do not notice any improvement, contact our office to discuss trying hydroxyzine (Atarax). Follow up with us  after completing the sleep study to review the results and discuss further treatment options.  Orders: Home sleep study  Follow-up 3 months with Landry NP  "

## 2024-05-06 NOTE — Addendum Note (Signed)
 Addended by: Reygan Heagle L on: 05/06/2024 08:09 AM   Modules accepted: Orders

## 2024-05-07 ENCOUNTER — Encounter (INDEPENDENT_AMBULATORY_CARE_PROVIDER_SITE_OTHER): Payer: Self-pay

## 2024-05-07 ENCOUNTER — Telehealth (INDEPENDENT_AMBULATORY_CARE_PROVIDER_SITE_OTHER): Payer: Self-pay

## 2024-05-07 NOTE — Telephone Encounter (Signed)
 Left voicemail to reschedule appointment on 05/21/2024 with [name], he will be out of office.

## 2024-05-17 NOTE — Progress Notes (Signed)
 "  Established Patient Office Visit Subjective:  Patient ID: [name], male    DOB: [birth year]  Age: 44 y.o. MRN: [id number]  CC: No chief complaint on file.     HPI [name] is here for return to work form.     Past Medical History:  Diagnosis Date   Arthritis    Gout    NGU (nongonococcal urethritis)    Raynaud's disease    Skin infection     No past surgical history on file.  Family History  Problem Relation Age of Onset   Arthritis Mother    Allergic rhinitis Father    Arthritis Father    Diabetes Father    Lupus Sister    Diabetes Maternal Grandmother    Diabetes Paternal Grandmother     Social History   Socioeconomic History   Marital status: Significant Other    Spouse name: Not on file   Number of children: Not on file   Years of education: Not on file   Highest education level: Not on file  Occupational History   Not on file  Tobacco Use   Smoking status: Former    Types: Cigarettes    Start date: 2004    Passive exposure: Never   Smokeless tobacco: Never  Vaping Use   Vaping status: Never Used  Substance and Sexual Activity   Alcohol use: Yes    Alcohol/week: 2.0 standard drinks of alcohol    Types: 2 Cans of beer per week    Comment: rarely   Drug use: Not Currently    Frequency: 3.0 times per week    Types: Marijuana   Sexual activity: Yes    Partners: Female    Birth control/protection: Condom  Other Topics Concern   Not on file  Social History Narrative   Not on file   Social Drivers of Health   Tobacco Use: Medium Risk (05/06/2024)   Patient History    Smoking Tobacco Use: Former    Smokeless Tobacco Use: Never    Passive Exposure: Never  Physicist, Medical Strain: Not on file  Food Insecurity: Patient Declined (03/04/2024)   Epic    Worried About Programme Researcher, Broadcasting/film/video in the Last Year: Patient declined    Barista in the Last Year: Patient declined  Transportation Needs: Patient Declined (03/04/2024)    Epic    Lack of Transportation (Medical): Patient declined    Lack of Transportation (Non-Medical): Patient declined  Physical Activity: Not on file  Stress: Not on file  Social Connections: Not on file  Intimate Partner Violence: Patient Declined (03/04/2024)   Epic    Fear of Current or Ex-Partner: Patient declined    Emotionally Abused: Patient declined    Physically Abused: Patient declined    Sexually Abused: Patient declined  Depression (PHQ2-9): Medium Risk (03/15/2024)   Depression (PHQ2-9)    PHQ-2 Score: 10  Alcohol Screen: Not on file  Housing: Patient Declined (03/04/2024)   Epic    Unable to Pay for Housing in the Last Year: Patient declined    Number of Times Moved in the Last Year: Not on file    Homeless in the Last Year: Patient declined  Utilities: Patient Declined (03/04/2024)   Epic    Threatened with loss of utilities: Patient declined  Health Literacy: Not on file    ROS All ROS negative except what is listed in the HPI.   Objective:   Today's Vitals: There were  no vitals taken for this visit.  Physical Exam  Assessment & Plan:   Problem List Items Addressed This Visit   None     Follow-up: No follow-ups on file.   Waddell FURY Almarie, DNP, FNP-C  I,Emily Lagle,acting as a neurosurgeon for Waddell KATHEE Almarie, NP.,have documented all relevant documentation on the behalf of Waddell KATHEE Almarie, NP.  I, Waddell KATHEE Almarie, NP, have reviewed all documentation for this visit. The documentation on 05/19/2024 for the exam, diagnosis, procedures, and orders are all accurate and complete.  "

## 2024-05-19 ENCOUNTER — Ambulatory Visit: Admitting: Family Medicine

## 2024-05-19 ENCOUNTER — Encounter: Payer: Self-pay | Admitting: Family Medicine

## 2024-05-19 ENCOUNTER — Ambulatory Visit: Admitting: Neurology

## 2024-05-19 VITALS — BP 131/79 | HR 93 | Temp 97.4°F | Ht 75.0 in | Wt 275.2 lb

## 2024-05-19 DIAGNOSIS — R7303 Prediabetes: Secondary | ICD-10-CM

## 2024-05-19 DIAGNOSIS — Z87898 Personal history of other specified conditions: Secondary | ICD-10-CM

## 2024-05-19 LAB — COMPREHENSIVE METABOLIC PANEL WITH GFR
ALT: 63 U/L — ABNORMAL HIGH (ref 3–53)
AST: 36 U/L (ref 5–37)
Albumin: 4.8 g/dL (ref 3.5–5.2)
Alkaline Phosphatase: 42 U/L (ref 39–117)
BUN: 13 mg/dL (ref 6–23)
CO2: 30 meq/L (ref 19–32)
Calcium: 10.3 mg/dL (ref 8.4–10.5)
Chloride: 100 meq/L (ref 96–112)
Creatinine, Ser: 1.02 mg/dL (ref 0.40–1.50)
GFR: 91.21 mL/min
Glucose, Bld: 105 mg/dL — ABNORMAL HIGH (ref 70–99)
Potassium: 4.5 meq/L (ref 3.5–5.1)
Sodium: 138 meq/L (ref 135–145)
Total Bilirubin: 0.4 mg/dL (ref 0.2–1.2)
Total Protein: 8.3 g/dL (ref 6.0–8.3)

## 2024-05-19 LAB — CBC WITH DIFFERENTIAL/PLATELET
Basophils Absolute: 0 10*3/uL (ref 0.0–0.1)
Basophils Relative: 0.5 % (ref 0.0–3.0)
Eosinophils Absolute: 0.1 10*3/uL (ref 0.0–0.7)
Eosinophils Relative: 1.6 % (ref 0.0–5.0)
HCT: 44.3 % (ref 39.0–52.0)
Hemoglobin: 14.3 g/dL (ref 13.0–17.0)
Lymphocytes Relative: 35.9 % (ref 12.0–46.0)
Lymphs Abs: 2.1 10*3/uL (ref 0.7–4.0)
MCHC: 32.4 g/dL (ref 30.0–36.0)
MCV: 83.5 fl (ref 78.0–100.0)
Monocytes Absolute: 0.6 10*3/uL (ref 0.1–1.0)
Monocytes Relative: 10.2 % (ref 3.0–12.0)
Neutro Abs: 3 10*3/uL (ref 1.4–7.7)
Neutrophils Relative %: 51.8 % (ref 43.0–77.0)
Platelets: 166 10*3/uL (ref 150.0–400.0)
RBC: 5.3 Mil/uL (ref 4.22–5.81)
RDW: 14 % (ref 11.5–15.5)
WBC: 5.8 10*3/uL (ref 4.0–10.5)

## 2024-05-19 MED ORDER — BLOOD GLUCOSE MONITORING SUPPL DEVI: 1.0000 | Freq: Three times a day (TID) | 0 refills | Status: AC

## 2024-05-19 MED ORDER — LANCETS MISC: 1.0000 | 0 refills | Status: AC

## 2024-05-19 MED ORDER — LANCET DEVICE MISC: 1.0000 | Freq: Three times a day (TID) | 0 refills | Status: AC

## 2024-05-19 MED ORDER — BLOOD GLUCOSE TEST VI STRP: 1.0000 | ORAL_STRIP | Freq: Three times a day (TID) | 0 refills | Status: AC

## 2024-05-20 ENCOUNTER — Ambulatory Visit: Payer: Self-pay | Admitting: Family Medicine

## 2024-05-20 ENCOUNTER — Encounter: Payer: Self-pay | Admitting: Family Medicine

## 2024-05-21 ENCOUNTER — Institutional Professional Consult (permissible substitution) (INDEPENDENT_AMBULATORY_CARE_PROVIDER_SITE_OTHER)

## 2024-06-08 ENCOUNTER — Institutional Professional Consult (permissible substitution) (INDEPENDENT_AMBULATORY_CARE_PROVIDER_SITE_OTHER)

## 2024-07-09 ENCOUNTER — Ambulatory Visit: Admitting: Cardiology

## 2024-08-04 ENCOUNTER — Ambulatory Visit: Admitting: Primary Care

## 2024-11-17 ENCOUNTER — Encounter: Admitting: Family Medicine
